# Patient Record
Sex: Female | Born: 1985 | Race: Black or African American | Hispanic: No | Smoking: Never smoker
Health system: Southern US, Community
[De-identification: ages and names within clinical notes are randomized; demographics above are authoritative.]

## PROBLEM LIST (undated history)

## (undated) DIAGNOSIS — J45909 Unspecified asthma, uncomplicated: Secondary | ICD-10-CM

## (undated) DIAGNOSIS — F419 Anxiety disorder, unspecified: Secondary | ICD-10-CM

## (undated) DIAGNOSIS — Z8619 Personal history of other infectious and parasitic diseases: Secondary | ICD-10-CM

## (undated) DIAGNOSIS — R87619 Unspecified abnormal cytological findings in specimens from cervix uteri: Secondary | ICD-10-CM

## (undated) DIAGNOSIS — B9689 Other specified bacterial agents as the cause of diseases classified elsewhere: Secondary | ICD-10-CM

## (undated) DIAGNOSIS — R112 Nausea with vomiting, unspecified: Secondary | ICD-10-CM

## (undated) DIAGNOSIS — M109 Gout, unspecified: Secondary | ICD-10-CM

## (undated) DIAGNOSIS — N83209 Unspecified ovarian cyst, unspecified side: Secondary | ICD-10-CM

## (undated) DIAGNOSIS — N912 Amenorrhea, unspecified: Secondary | ICD-10-CM

## (undated) DIAGNOSIS — O039 Complete or unspecified spontaneous abortion without complication: Secondary | ICD-10-CM

## (undated) DIAGNOSIS — IMO0002 Reserved for concepts with insufficient information to code with codable children: Secondary | ICD-10-CM

## (undated) DIAGNOSIS — I10 Essential (primary) hypertension: Secondary | ICD-10-CM

## (undated) DIAGNOSIS — N760 Acute vaginitis: Secondary | ICD-10-CM

## (undated) DIAGNOSIS — R0602 Shortness of breath: Secondary | ICD-10-CM

## (undated) DIAGNOSIS — N3090 Cystitis, unspecified without hematuria: Secondary | ICD-10-CM

## (undated) DIAGNOSIS — D649 Anemia, unspecified: Secondary | ICD-10-CM

## (undated) DIAGNOSIS — A749 Chlamydial infection, unspecified: Secondary | ICD-10-CM

## (undated) DIAGNOSIS — E785 Hyperlipidemia, unspecified: Secondary | ICD-10-CM

## (undated) DIAGNOSIS — G43909 Migraine, unspecified, not intractable, without status migrainosus: Secondary | ICD-10-CM

## (undated) DIAGNOSIS — Z9889 Other specified postprocedural states: Secondary | ICD-10-CM

## (undated) DIAGNOSIS — B379 Candidiasis, unspecified: Secondary | ICD-10-CM

## (undated) HISTORY — DX: Chlamydial infection, unspecified: A74.9

## (undated) HISTORY — DX: Amenorrhea, unspecified: N91.2

## (undated) HISTORY — DX: Personal history of other infectious and parasitic diseases: Z86.19

## (undated) HISTORY — DX: Other specified bacterial agents as the cause of diseases classified elsewhere: N76.0

## (undated) HISTORY — DX: Reserved for concepts with insufficient information to code with codable children: IMO0002

## (undated) HISTORY — DX: Candidiasis, unspecified: B37.9

## (undated) HISTORY — DX: Hyperlipidemia, unspecified: E78.5

## (undated) HISTORY — DX: Shortness of breath: R06.02

## (undated) HISTORY — DX: Anemia, unspecified: D64.9

## (undated) HISTORY — DX: Anxiety disorder, unspecified: F41.9

## (undated) HISTORY — DX: Unspecified asthma, uncomplicated: J45.909

## (undated) HISTORY — DX: Other specified bacterial agents as the cause of diseases classified elsewhere: B96.89

## (undated) HISTORY — DX: Cystitis, unspecified without hematuria: N30.90

## (undated) HISTORY — DX: Migraine, unspecified, not intractable, without status migrainosus: G43.909

## (undated) HISTORY — PX: TONSILECTOMY, ADENOIDECTOMY, BILATERAL MYRINGOTOMY AND TUBES: SHX2538

## (undated) HISTORY — DX: Unspecified ovarian cyst, unspecified side: N83.209

## (undated) HISTORY — DX: Gout, unspecified: M10.9

## (undated) HISTORY — DX: Essential (primary) hypertension: I10

## (undated) HISTORY — DX: Complete or unspecified spontaneous abortion without complication: O03.9

## (undated) HISTORY — DX: Unspecified abnormal cytological findings in specimens from cervix uteri: R87.619

---

## 2005-04-14 HISTORY — PX: DILATION AND CURETTAGE OF UTERUS: SHX78

## 2007-04-15 HISTORY — PX: OTHER SURGICAL HISTORY: SHX169

## 2008-02-14 ENCOUNTER — Inpatient Hospital Stay (HOSPITAL_COMMUNITY): Admission: AD | Admit: 2008-02-14 | Discharge: 2008-02-14 | Payer: Self-pay | Admitting: Obstetrics and Gynecology

## 2008-02-16 ENCOUNTER — Inpatient Hospital Stay (HOSPITAL_COMMUNITY): Admission: AD | Admit: 2008-02-16 | Discharge: 2008-02-17 | Payer: Self-pay | Admitting: Obstetrics and Gynecology

## 2008-03-02 ENCOUNTER — Inpatient Hospital Stay (HOSPITAL_COMMUNITY): Admission: AD | Admit: 2008-03-02 | Discharge: 2008-03-02 | Payer: Self-pay | Admitting: Obstetrics and Gynecology

## 2008-03-29 ENCOUNTER — Inpatient Hospital Stay (HOSPITAL_COMMUNITY): Admission: AD | Admit: 2008-03-29 | Discharge: 2008-03-29 | Payer: Self-pay | Admitting: Obstetrics and Gynecology

## 2008-03-30 ENCOUNTER — Inpatient Hospital Stay (HOSPITAL_COMMUNITY): Admission: AD | Admit: 2008-03-30 | Discharge: 2008-03-30 | Payer: Self-pay | Admitting: Obstetrics and Gynecology

## 2008-04-16 ENCOUNTER — Inpatient Hospital Stay (HOSPITAL_COMMUNITY): Admission: AD | Admit: 2008-04-16 | Discharge: 2008-04-16 | Payer: Self-pay | Admitting: Obstetrics and Gynecology

## 2008-05-01 ENCOUNTER — Inpatient Hospital Stay (HOSPITAL_COMMUNITY): Admission: AD | Admit: 2008-05-01 | Discharge: 2008-05-04 | Payer: Self-pay | Admitting: Obstetrics and Gynecology

## 2008-05-18 ENCOUNTER — Inpatient Hospital Stay (HOSPITAL_COMMUNITY): Admission: AD | Admit: 2008-05-18 | Discharge: 2008-05-18 | Payer: Self-pay | Admitting: Obstetrics and Gynecology

## 2009-10-23 ENCOUNTER — Inpatient Hospital Stay (HOSPITAL_COMMUNITY): Admission: AD | Admit: 2009-10-23 | Discharge: 2009-10-23 | Payer: Self-pay | Admitting: Obstetrics and Gynecology

## 2009-10-31 ENCOUNTER — Ambulatory Visit (HOSPITAL_COMMUNITY): Admission: RE | Admit: 2009-10-31 | Discharge: 2009-10-31 | Payer: Self-pay | Admitting: Obstetrics and Gynecology

## 2009-11-21 ENCOUNTER — Ambulatory Visit (HOSPITAL_COMMUNITY): Admission: RE | Admit: 2009-11-21 | Discharge: 2009-11-21 | Payer: Self-pay | Admitting: Obstetrics and Gynecology

## 2010-01-31 ENCOUNTER — Inpatient Hospital Stay (HOSPITAL_COMMUNITY): Admission: AD | Admit: 2010-01-31 | Discharge: 2010-02-01 | Payer: Self-pay | Admitting: Obstetrics and Gynecology

## 2010-02-12 ENCOUNTER — Inpatient Hospital Stay (HOSPITAL_COMMUNITY): Admission: AD | Admit: 2010-02-12 | Discharge: 2010-02-12 | Payer: Self-pay | Admitting: Obstetrics and Gynecology

## 2010-03-21 ENCOUNTER — Inpatient Hospital Stay (HOSPITAL_COMMUNITY)
Admission: AD | Admit: 2010-03-21 | Discharge: 2010-03-21 | Payer: Self-pay | Source: Home / Self Care | Admitting: Obstetrics and Gynecology

## 2010-04-14 ENCOUNTER — Inpatient Hospital Stay (HOSPITAL_COMMUNITY)
Admission: AD | Admit: 2010-04-14 | Discharge: 2010-04-14 | Payer: Self-pay | Source: Home / Self Care | Attending: Obstetrics and Gynecology | Admitting: Obstetrics and Gynecology

## 2010-04-24 ENCOUNTER — Inpatient Hospital Stay (HOSPITAL_COMMUNITY)
Admission: RE | Admit: 2010-04-24 | Discharge: 2010-04-26 | Payer: Self-pay | Source: Home / Self Care | Attending: Obstetrics and Gynecology | Admitting: Obstetrics and Gynecology

## 2010-04-24 ENCOUNTER — Encounter (INDEPENDENT_AMBULATORY_CARE_PROVIDER_SITE_OTHER): Payer: Self-pay | Admitting: Obstetrics and Gynecology

## 2010-04-29 LAB — CBC
HCT: 34.9 % — ABNORMAL LOW (ref 36.0–46.0)
HCT: 32.8 % — ABNORMAL LOW (ref 36.0–46.0)
Hemoglobin: 11.7 g/dL — ABNORMAL LOW (ref 12.0–15.0)
Hemoglobin: 10.8 g/dL — ABNORMAL LOW (ref 12.0–15.0)
MCH: 26.5 pg (ref 26.0–34.0)
MCH: 26.2 pg (ref 26.0–34.0)
MCHC: 33.5 g/dL (ref 30.0–36.0)
MCHC: 32.9 g/dL (ref 30.0–36.0)
MCV: 79.1 fL (ref 78.0–100.0)
MCV: 79.4 fL (ref 78.0–100.0)
Platelets: 302 10*3/uL (ref 150–400)
Platelets: 259 10*3/uL (ref 150–400)
RBC: 4.41 MIL/uL (ref 3.87–5.11)
RBC: 4.13 MIL/uL (ref 3.87–5.11)
RDW: 14.5 % (ref 11.5–15.5)
RDW: 14.5 % (ref 11.5–15.5)
WBC: 6.4 10*3/uL (ref 4.0–10.5)
WBC: 6.7 10*3/uL (ref 4.0–10.5)

## 2010-04-29 LAB — RPR: RPR Ser Ql: NONREACTIVE

## 2010-06-18 ENCOUNTER — Other Ambulatory Visit: Payer: Self-pay | Admitting: Family Medicine

## 2010-06-21 ENCOUNTER — Ambulatory Visit
Admission: RE | Admit: 2010-06-21 | Discharge: 2010-06-21 | Disposition: A | Discharge status: Home / Self Care | Payer: BC Managed Care – PPO | Source: Ambulatory Visit | Attending: Family Medicine | Admitting: Family Medicine

## 2010-06-25 LAB — URINALYSIS, ROUTINE W REFLEX MICROSCOPIC
Bilirubin Urine: NEGATIVE
Glucose, UA: NEGATIVE mg/dL
Ketones, ur: NEGATIVE mg/dL
Nitrite: NEGATIVE
Protein, ur: NEGATIVE mg/dL
Specific Gravity, Urine: 1.02 (ref 1.005–1.030)
Urobilinogen, UA: 0.2 mg/dL (ref 0.0–1.0)
pH: 6 (ref 5.0–8.0)

## 2010-06-25 LAB — URINALYSIS, DIPSTICK ONLY
Bilirubin Urine: NEGATIVE
Glucose, UA: NEGATIVE mg/dL
Ketones, ur: 15 mg/dL — AB
Nitrite: NEGATIVE
Protein, ur: NEGATIVE mg/dL
Specific Gravity, Urine: 1.02 (ref 1.005–1.030)
Urobilinogen, UA: 0.2 mg/dL (ref 0.0–1.0)
pH: 6 (ref 5.0–8.0)

## 2010-06-25 LAB — URINE MICROSCOPIC-ADD ON

## 2010-07-29 LAB — COMPREHENSIVE METABOLIC PANEL
ALT: 13 U/L (ref 0–35)
AST: 19 U/L (ref 0–37)
Albumin: 2.7 g/dL — ABNORMAL LOW (ref 3.5–5.2)
Alkaline Phosphatase: 142 U/L — ABNORMAL HIGH (ref 39–117)
BUN: 5 mg/dL — ABNORMAL LOW (ref 6–23)
CO2: 20 mEq/L (ref 19–32)
Calcium: 9 mg/dL (ref 8.4–10.5)
Chloride: 108 mEq/L (ref 96–112)
Creatinine, Ser: 0.32 mg/dL — ABNORMAL LOW (ref 0.4–1.2)
GFR calc Af Amer: >60 mL/min (ref >60)
GFR calc non Af Amer: >60 mL/min (ref >60)
Glucose, Bld: 84 mg/dL (ref 70–99)
Potassium: 3.7 mEq/L (ref 3.5–5.1)
Sodium: 135 mEq/L (ref 135–145)
Total Bilirubin: 0.2 mg/dL — ABNORMAL LOW (ref 0.3–1.2)
Total Protein: 6.5 g/dL (ref 6.0–8.3)

## 2010-07-29 LAB — CBC
HCT: 32.6 % — ABNORMAL LOW (ref 36.0–46.0)
HCT: 35.2 % — ABNORMAL LOW (ref 36.0–46.0)
HCT: 35.3 % — ABNORMAL LOW (ref 36.0–46.0)
Hemoglobin: 11.1 g/dL — ABNORMAL LOW (ref 12.0–15.0)
Hemoglobin: 11.7 g/dL — ABNORMAL LOW (ref 12.0–15.0)
Hemoglobin: 12.1 g/dL (ref 12.0–15.0)
MCHC: 33.1 g/dL (ref 30.0–36.0)
MCHC: 33.9 g/dL (ref 30.0–36.0)
MCHC: 34.3 g/dL (ref 30.0–36.0)
MCV: 81.9 fL (ref 78.0–100.0)
MCV: 82.2 fL (ref 78.0–100.0)
MCV: 82.7 fL (ref 78.0–100.0)
Platelets: 278 10*3/uL (ref 150–400)
Platelets: 346 10*3/uL (ref 150–400)
Platelets: 371 10*3/uL (ref 150–400)
RBC: 3.94 MIL/uL (ref 3.87–5.11)
RBC: 4.28 MIL/uL (ref 3.87–5.11)
RBC: 4.3 MIL/uL (ref 3.87–5.11)
RDW: 13.9 % (ref 11.5–15.5)
RDW: 14.2 % (ref 11.5–15.5)
RDW: 14.3 % (ref 11.5–15.5)
WBC: 6.8 10*3/uL (ref 4.0–10.5)
WBC: 7.8 10*3/uL (ref 4.0–10.5)
WBC: 8.4 10*3/uL (ref 4.0–10.5)

## 2010-07-29 LAB — URINALYSIS, ROUTINE W REFLEX MICROSCOPIC
Bilirubin Urine: NEGATIVE
Glucose, UA: NEGATIVE mg/dL
Hgb urine dipstick: NEGATIVE
Ketones, ur: 15 mg/dL — AB
Nitrite: NEGATIVE
Protein, ur: NEGATIVE mg/dL
Specific Gravity, Urine: 1.025 (ref 1.005–1.030)
Urobilinogen, UA: 0.2 mg/dL (ref 0.0–1.0)
pH: 6 (ref 5.0–8.0)

## 2010-07-29 LAB — LACTATE DEHYDROGENASE: LDH: 129 U/L (ref 94–250)

## 2010-07-29 LAB — RPR
RPR Ser Ql: NONREACTIVE
RPR Ser Ql: NONREACTIVE

## 2010-07-29 LAB — URIC ACID: Uric Acid, Serum: 5.2 mg/dL (ref 2.4–7.0)

## 2010-07-30 LAB — COMPREHENSIVE METABOLIC PANEL
AST: 16 U/L (ref 0–37)
Albumin: 3.6 g/dL (ref 3.5–5.2)
BUN: 12 mg/dL (ref 6–23)
Calcium: 9.3 mg/dL (ref 8.4–10.5)
Creatinine, Ser: 0.62 mg/dL (ref 0.4–1.2)
GFR calc Af Amer: >60 mL/min (ref >60)
GFR calc non Af Amer: >60 mL/min (ref >60)

## 2010-07-30 LAB — URINALYSIS, ROUTINE W REFLEX MICROSCOPIC
Bilirubin Urine: NEGATIVE
Glucose, UA: NEGATIVE mg/dL
Ketones, ur: NEGATIVE mg/dL
Leukocytes, UA: NEGATIVE
Nitrite: NEGATIVE
Protein, ur: NEGATIVE mg/dL
Specific Gravity, Urine: 1.01 (ref 1.005–1.030)
Urobilinogen, UA: 0.2 mg/dL (ref 0.0–1.0)
pH: 6.5 (ref 5.0–8.0)

## 2010-07-30 LAB — CBC
HCT: 38.7 % (ref 36.0–46.0)
MCHC: 33.3 g/dL (ref 30.0–36.0)
MCV: 82.1 fL (ref 78.0–100.0)
Platelets: 380 10*3/uL (ref 150–400)

## 2010-07-30 LAB — URINE MICROSCOPIC-ADD ON

## 2010-07-30 LAB — URIC ACID: Uric Acid, Serum: 7.5 mg/dL — ABNORMAL HIGH (ref 2.4–7.0)

## 2010-08-13 ENCOUNTER — Ambulatory Visit: Payer: BC Managed Care – PPO | Admitting: *Deleted

## 2010-08-27 NOTE — H&P (Signed)
NAMEWILLY, Rebecca Fry               ACCOUNT NO.:  [id number]   MEDICAL RECORD NO.:  [id number]          PATIENT TYPE:  INP   LOCATION:  9169                          FACILITY:  [facility]   PHYSICIAN:  Osborn Coho, M.D.   DATE OF BIRTH:  June 29, 1985   DATE OF ADMISSION:  05/01/2008  DATE OF DISCHARGE:                              HISTORY & PHYSICAL   Rebecca Fry is a 25 year old gravida 4, para 0-1-2-1 at 38-2/7 weeks who  presented complaining of questionable leaking since 6:00 p.m. with  uterine contractions every 7-10 minutes since approximately 3:00 p.m.  She denies any bleeding.  She did report decreased fetal movement today.  Cervix had been 3 cm in the office last week.  She had a last ultrasound  for growth at 34 weeks.  Pregnancy has been remarkable for:  1. History of 36-week delivery.  2. First trimester spotting.  3. Borderline hypertension.  No medications, and she has had BPP every      week.   While in maternity admissions unit.  The patient did demonstrate a small  amount of cervical change.  Fetal heart rate was reassuring but was not  reactive and a decision was made to admit her for further labor care.   PRENATAL LABS:  Blood type is A positive.  Rh antibody negative.  VDRL  nonreactive.  Rubella titer positive.  Hepatitis B surface antigen  negative, HIV is nonreactive.  Sickle cell test was negative.  GC  chlamydia cultures were negative in June.  Pap was normal in August  2009.  Cystic fibrosis testing was negative.  Sickle cell test was  negative.  The patient had a new normal first trimester screen and  normal AFP.  Hemoglobin upon entering the practice was 12.1.  It was  10.8 at 27 weeks.  Glucola was normal at 71.  Fetal fibronectin was done  at 26 weeks and was negative.  She had another negative fetal  fibronectin at 28 weeks.  She had a 24-hour urine done at 34 weeks  showing 97 mg of protein and a 24-hour specimen.  She had PIH workup  done at 29 weeks and  she had some elevated blood pressures.  These were  negative findings.  She had another fetal fibronectin at 31 weeks.  GC  chlamydia cultures were done at 31 weeks and were negative with positive  group B strep culture at 36 weeks.   HISTORY OF PRESENT PREGNANCY:  The patient entered care at approximately  15 weeks.  She had a first trimester screen prior to entering care at  [facility].  This were negative.  She had an AFP that was normal.  She had an ultrasound at 18 weeks showing normal growth and the anterior  placenta.  She was placed on Macrobid at 21 weeks for questionable UTI.  She had a normal Glucola.  She had H1N1 vaccine at 26 weeks.  She had  some cramping at 27 and 28 weeks and was seen at [facility], placed  on Motrin at that time.  She was noted at 28 weeks  to 1.5 cm.  Fetal  fibronectin was done at that time and was negative.  She began to have a  headache at 29 weeks with some swelling.  Her blood pressure at that  time was 150/98.  Previous blood pressures have been 140/82, 120/74.  She was sent to maternity admissions unit at that time for [facility] workup.  A 24-hour urine showed 97 mg of protein and 24-hour specimen.  Blood  pressure continued to have some lability at 31 weeks.  She was evaluated  for questionable leaking.  Fetal fibronectin was done at that time and  was negative.  Blood pressure was 160/90 and 150/88.  Fetal fibronectin  was done at 31 weeks and was negative.  GC chlamydia cultures were sent.  At that time, she began to have a BPPs every week.  She had fairly poor  weight gain during her pregnancy.  At 34 weeks, she had an ultrasound  for growth showing normal growth.  Cervix at that time was closed.  Fluid was normal and growth was 75th percentile.  She has continued to  be followed at [facility] by BPPs weekly.   OBSTETRICAL HISTORY:  In 2005, she had a 5-week miscarriage.  In 2007,  she had a vaginal birth of a female infant weight  7 pounds at 36 weeks.  She was in labor 10 hours.  She had epidural anesthesia.  She did have  preterm labor with that pregnancy.  In August 2007, she had a  termination.   MEDICAL HISTORY:  She is previous patch an IUD user.  Her IUD was  removed approximately October 2008.  She reports one abnormal Pap with a  normal colposcopy.  She reports usual childhood illnesses.  The patient  does have a history of some chronic hypertension in the past,  borderline.  The patient has had a history of anemia in the past and she  had been on B12 shots in the past.  She has some occasional UTIs.   ALLERGIES:  She has no known medication allergies.   SURGICAL HISTORY:  Lipomas in stomach was removed in April 2009 and in  December.  She had a D&C in August 2007 and for the previously noted  termination.   FAMILY HISTORY:  Whole family has hypertension.  Paternal grandmother  has varicose veins.  Her mother has anemia.  Her father and maternal  aunt and paternal uncles have diabetes.  Her paternal grandmother has  thyroid dysfunction.  Paternal grandmother is on dialysis.  Maternal  first cousin has had strokes.  Her cousin used alcohol and nicotine.  The patient does report that any anesthesia wears off quickly..   GENETIC HISTORY:  Remarkable for father of baby with extra digits and  the patient's brother born with a hole in his heart.   SOCIAL HISTORY:  The patient is married to the father of baby.  He is  involved and supportive.  His name is [employer].  The patient is  college educated.  She is a Runner, broadcasting/film/video.  Her husband has 2 years of  college.  He is a bus Hospital doctor.  The patient is Tree surgeon.  She is  of the [place] faith.  She denies any alcohol, drug, or tobacco use  during this pregnancy.  She has been followed by the physician's service  [facility].   PHYSICAL EXAMINATION:  VITAL SIGNS:  Blood pressure 134/84, 151  __________.  HEENT:  Within normal limits.  LUNGS:  Her breath sounds are clear.  HEART:  Regular rate and rhythm without murmur.  BREASTS:  Soft and nontender.  ABDOMEN:  Fundal height is approximately 38 cm.  Estimated fetal weight  7 to 7-1/2 pounds.  Uterine contractions are every 2-3 minutes, mild-to-  moderate quality.  CERVIX:  Initially was 2-3, now it is 3-4, 75% vertex at a -1 station.  There is no evidence of leaking fluid noted with a negative sterile  speculum exam showing no ferning and no pooling.  Fetal heart rate has  been nonreactive, but reassuring.  There was 1 accel with  scalpstimulation and the patient has had some segments of a negative  spontaneous CST.  EXTREMITIES:  Deep tendon reflexes are 2+ without  clonus.  There is trace to 1+ edema noted.   Ultrasound today in maternity admissions unit showed a vertex  presentation.  Estimated fetal weight 7 pounds 12 ounces, approximately  3505 grams at the 82nd percentile.  Amniotic fluid volume was noted to  be subjectively low normal for an AFI volume of 7.66 and at the 8th  percentile.  BPP was 8/8.   IMPRESSION:  1. Intrauterine pregnancy at 38-2/7 weeks.  2. Early labor.  3. Subjectively low normal fluid.  4. Nonreactive fetal heart rate tracing, but overall reassuring.  5. Positive group B streptococcus.   PLAN:  1. Admit to birthing suite, consult with Dr. Osborn Coho as      attending physician.  2. Routine physician orders.  3. We will plan PIH labs with routine admit labs.  4. Group B strep prophylaxis, penicillin G per standard dosing.  5. Pain medication p.r.n.  6. MDs will follow.      Renaldo Reel Emilee Hero, C.N.M.      Osborn Coho, M.D.  Electronically Signed    VLL/MEDQ  D:  05/01/2008  T:  05/02/2008  Job:  [id number]

## 2010-09-02 ENCOUNTER — Encounter: Payer: BC Managed Care – PPO | Attending: Family Medicine | Admitting: *Deleted

## 2010-09-02 DIAGNOSIS — I10 Essential (primary) hypertension: Secondary | ICD-10-CM | POA: Insufficient documentation

## 2010-09-02 DIAGNOSIS — Z713 Dietary counseling and surveillance: Secondary | ICD-10-CM | POA: Insufficient documentation

## 2010-09-02 DIAGNOSIS — E669 Obesity, unspecified: Secondary | ICD-10-CM | POA: Insufficient documentation

## 2010-09-25 ENCOUNTER — Encounter: Payer: BC Managed Care – PPO | Attending: Family Medicine | Admitting: *Deleted

## 2010-09-25 DIAGNOSIS — Z713 Dietary counseling and surveillance: Secondary | ICD-10-CM | POA: Insufficient documentation

## 2010-09-25 DIAGNOSIS — E669 Obesity, unspecified: Secondary | ICD-10-CM | POA: Insufficient documentation

## 2010-09-25 DIAGNOSIS — I10 Essential (primary) hypertension: Secondary | ICD-10-CM | POA: Insufficient documentation

## 2010-10-28 ENCOUNTER — Ambulatory Visit: Payer: BC Managed Care – PPO | Admitting: *Deleted

## 2011-01-15 LAB — URINALYSIS, ROUTINE W REFLEX MICROSCOPIC
Bilirubin Urine: NEGATIVE
Glucose, UA: NEGATIVE
Glucose, UA: NEGATIVE
Hgb urine dipstick: NEGATIVE
Ketones, ur: 15 — AB
Ketones, ur: NEGATIVE
Nitrite: NEGATIVE
Protein, ur: NEGATIVE
Specific Gravity, Urine: 1.025
pH: 6
pH: 6

## 2011-01-15 LAB — COMPREHENSIVE METABOLIC PANEL
ALT: 12
BUN: 2 — ABNORMAL LOW
CO2: 25
Calcium: 9.3
GFR calc non Af Amer: >60
Glucose, Bld: 85
Sodium: 136
Total Protein: 6.2

## 2011-01-15 LAB — WET PREP, GENITAL
Clue Cells Wet Prep HPF POC: NONE SEEN
Trich, Wet Prep: NONE SEEN
Yeast Wet Prep HPF POC: NONE SEEN

## 2011-01-15 LAB — CBC
HCT: 33.4 — ABNORMAL LOW
Hemoglobin: 11.5 — ABNORMAL LOW
MCHC: 34.5
MCV: 82.2
RBC: 4.06
RDW: 14.1

## 2011-01-15 LAB — URINE CULTURE

## 2011-01-15 LAB — LACTATE DEHYDROGENASE: LDH: 110

## 2011-01-15 LAB — URINE MICROSCOPIC-ADD ON

## 2011-01-15 LAB — URIC ACID: Uric Acid, Serum: 4.4

## 2011-01-17 LAB — WET PREP, GENITAL
WBC, Wet Prep HPF POC: NONE SEEN
Yeast Wet Prep HPF POC: NONE SEEN

## 2011-01-17 LAB — URINALYSIS, ROUTINE W REFLEX MICROSCOPIC
Hgb urine dipstick: NEGATIVE
Nitrite: NEGATIVE
Specific Gravity, Urine: 1.01 (ref 1.005–1.030)
Urobilinogen, UA: 0.2 mg/dL (ref 0.0–1.0)
pH: 5.5 (ref 5.0–8.0)

## 2011-01-17 LAB — GC/CHLAMYDIA PROBE AMP, GENITAL: Chlamydia, DNA Probe: NEGATIVE

## 2011-01-17 LAB — FETAL FIBRONECTIN: Fetal Fibronectin: POSITIVE

## 2011-10-05 DIAGNOSIS — N939 Abnormal uterine and vaginal bleeding, unspecified: Secondary | ICD-10-CM | POA: Diagnosis present

## 2011-11-02 ENCOUNTER — Other Ambulatory Visit: Payer: Self-pay

## 2011-11-02 ENCOUNTER — Emergency Department (HOSPITAL_COMMUNITY)
Admission: EM | Admit: 2011-11-02 | Discharge: 2011-11-03 | Disposition: A | Discharge status: Home / Self Care | Payer: BC Managed Care – PPO | Attending: Emergency Medicine | Admitting: Emergency Medicine

## 2011-11-02 DIAGNOSIS — I10 Essential (primary) hypertension: Secondary | ICD-10-CM | POA: Insufficient documentation

## 2011-11-02 DIAGNOSIS — R42 Dizziness and giddiness: Secondary | ICD-10-CM | POA: Insufficient documentation

## 2011-11-02 DIAGNOSIS — R079 Chest pain, unspecified: Secondary | ICD-10-CM | POA: Insufficient documentation

## 2011-11-02 DIAGNOSIS — E119 Type 2 diabetes mellitus without complications: Secondary | ICD-10-CM | POA: Insufficient documentation

## 2011-11-03 ENCOUNTER — Emergency Department (HOSPITAL_COMMUNITY): Payer: BC Managed Care – PPO

## 2011-11-03 ENCOUNTER — Telehealth: Payer: Self-pay | Admitting: Obstetrics and Gynecology

## 2011-11-03 MED FILL — Meclizine HCl Tab 25 MG: ORAL | Qty: 1 | Status: AC

## 2011-11-03 MED FILL — Lorazepam Tab 1 MG: ORAL | Qty: 1 | Status: AC

## 2011-11-03 NOTE — Telephone Encounter (Signed)
Pt called needing follow-up appointment regarding ER visit at [facility]/ early pregnancy spotting.Rebecca Fry

## 2011-11-03 NOTE — ED Notes (Signed)
[id number]<AV> Expected date:<BR> Expected time:<BR> Means of arrival:<BR> Comments:<BR> Downtime Alleen Borne

## 2011-11-04 ENCOUNTER — Encounter: Payer: BC Managed Care – PPO | Admitting: Obstetrics and Gynecology

## 2011-11-11 ENCOUNTER — Ambulatory Visit: Payer: BC Managed Care – PPO | Admitting: Obstetrics and Gynecology

## 2011-11-21 ENCOUNTER — Encounter: Payer: Self-pay | Admitting: Obstetrics and Gynecology

## 2011-11-26 ENCOUNTER — Ambulatory Visit: Payer: BC Managed Care – PPO | Admitting: Obstetrics and Gynecology

## 2012-02-04 ENCOUNTER — Encounter: Payer: BC Managed Care – PPO | Admitting: Obstetrics and Gynecology

## 2012-03-15 ENCOUNTER — Ambulatory Visit (INDEPENDENT_AMBULATORY_CARE_PROVIDER_SITE_OTHER): Payer: BC Managed Care – PPO | Admitting: Obstetrics and Gynecology

## 2012-03-15 ENCOUNTER — Encounter: Payer: Self-pay | Admitting: Obstetrics and Gynecology

## 2012-03-15 VITALS — BP 120/78 | Temp 98.4°F | Wt 232.0 lb

## 2012-03-15 DIAGNOSIS — I1A0 Resistant hypertension: Secondary | ICD-10-CM | POA: Insufficient documentation

## 2012-03-15 DIAGNOSIS — Z87898 Personal history of other specified conditions: Secondary | ICD-10-CM

## 2012-03-15 DIAGNOSIS — N939 Abnormal uterine and vaginal bleeding, unspecified: Secondary | ICD-10-CM

## 2012-03-15 DIAGNOSIS — N898 Other specified noninflammatory disorders of vagina: Secondary | ICD-10-CM

## 2012-03-15 DIAGNOSIS — I10 Essential (primary) hypertension: Secondary | ICD-10-CM | POA: Insufficient documentation

## 2012-03-15 DIAGNOSIS — Z8742 Personal history of other diseases of the female genital tract: Secondary | ICD-10-CM

## 2012-03-15 HISTORY — DX: Resistant hypertension: I1A.0

## 2012-03-15 LAB — POCT URINALYSIS DIPSTICK
Glucose, UA: NEGATIVE
Leukocytes, UA: NEGATIVE
Urobilinogen, UA: NEGATIVE

## 2012-03-15 LAB — POCT URINE PREGNANCY: Preg Test, Ur: NEGATIVE

## 2012-03-15 MED ORDER — MEDROXYPROGESTERONE ACETATE 10 MG PO TABS: ORAL_TABLET | ORAL | Status: DC

## 2012-03-15 NOTE — Progress Notes (Signed)
Subjective:    Rebecca Fry is a 26 y.o. female, [phone], who presents for vaginal bleeding x 6 mos. Off and on almost daily.  At its heaviest it can require pad changes every 2 hours.  Some cramping with bleeding  The following portions of the patient's history were reviewed and updated as appropriate: allergies, current medications, past family history. Irregular bleeding When did bleeding start: 6 mos ago How  Long: current How often changing pad/tampon: She wears two pads with a tampon changing every one to two hours. After approximately two weeks, bleeding finally eases up changing one pap with tampon every 4 hrs.  Bleeding Disorders: no Cramping: yes Contraception: no Fibroids: no Hormone Therapy: no New Medications: no Menopausal Symptoms: no Vag. Discharge: yes  Abdominal Pain: yes Increased Stress: yes  Review of Systems Pertinent items are noted in HPI. Breast:Negative for breast lump,nipple discharge or nipple retraction Gastrointestinal: Negative for abdominal pain, change in bowel habits or rectal bleeding Urinary:negative   Objective:    BP 120/78  Resp 18  Wt 232 lb (105.235 kg)  LMP 03/13/2012  Breastfeeding? No    Weight:  Wt Readings from Last 1 Encounters:  03/15/12 232 lb (105.235 kg)          BMI: There is no height on file to calculate BMI.  General Appearance: Alert, appropriate appearance for age. Obese.  No acute distress Pelvic exam:  VULVA: normal appearing vulva with no masses, tenderness or lesions,    VAGINA: normal appearing vagina with normal color and discharge, no lesions,    CERVIX: normal appearing cervix without discharge or lesions, small amot contact bleeding,    UTERUS: uterus is normal size, shape, consistency and nontender, Exam is limited by body habitus,    ADNEXA: no masses, Exam is limited by body habitus,    RECTAL: normal rectal, no masses.   Assessment:    abnormal uterine bleeding with hx most consistent with polyp  or fibroid    Plan:   TSH, CBC pap smear Provera 40 mg daily until bleeding stops.  Pt to call to schedule [facility] as soon as bleeding stop  Dierdre Forth MD

## 2012-03-16 LAB — PAP IG W/ RFLX HPV ASCU

## 2012-03-16 LAB — CBC
HCT: 36.6 % (ref 36.0–46.0)
Hemoglobin: 12.3 g/dL (ref 12.0–15.0)
MCH: 25.5 pg — ABNORMAL LOW (ref 26.0–34.0)
MCV: 75.8 fL — ABNORMAL LOW (ref 78.0–100.0)
RBC: 4.83 MIL/uL (ref 3.87–5.11)

## 2012-03-24 ENCOUNTER — Other Ambulatory Visit: Payer: Self-pay

## 2012-03-24 ENCOUNTER — Ambulatory Visit (INDEPENDENT_AMBULATORY_CARE_PROVIDER_SITE_OTHER): Payer: BC Managed Care – PPO

## 2012-03-24 ENCOUNTER — Ambulatory Visit (INDEPENDENT_AMBULATORY_CARE_PROVIDER_SITE_OTHER): Payer: BC Managed Care – PPO | Admitting: Obstetrics and Gynecology

## 2012-03-24 ENCOUNTER — Other Ambulatory Visit: Payer: Self-pay | Admitting: Obstetrics and Gynecology

## 2012-03-24 DIAGNOSIS — N939 Abnormal uterine and vaginal bleeding, unspecified: Secondary | ICD-10-CM | POA: Insufficient documentation

## 2012-03-24 DIAGNOSIS — N898 Other specified noninflammatory disorders of vagina: Secondary | ICD-10-CM

## 2012-03-24 LAB — HCG, QUANTITATIVE, PREGNANCY: hCG, Beta Chain, Quant, S: <2 m[IU]/mL

## 2012-03-24 NOTE — Progress Notes (Signed)
SONOHYSTEROGRAM  Indications for the procedure, risks and benefits have been reviewed.  Questions were answered.   A permit has been signed.  An ultrasound was performed.   PROCEDURE The vagina and cervix were prepped with Betadine.  The sonohysterogram catheter was placed inside the uterus.  20 cc of sterile saline were injected.  A 3-D ultrasound was perfored   The patient tolerated her procedure well.  All instruments were removed.  The patient was returned to the supine position.  Results  ULTRASOUND: Uterus: Length: 7.59 cm   Width:  4.66 cm   Height:  4.01 cm  Endo thickness:  0.774 cm   Left ovary:Normal Right ovary:simple cyst/follicle. Measures: 3.0cmx2.7cmx2.7cm Fibroids:  none  CDS fluid:no Comment: Urinary bladder-unremarkable. Anteverted uterus. There is a 4.74mm, shadowing, echogenic focus in right myometrium. Could represent a calcified gland, vessel, or fibroid. There is asymmetric thickening of the endometrium on the posterior and left lateral cavity walls. Right cavity walls are clean and smooth in appearance.   ASSEESSMENT: New onset menorrhagia, initially responsive to Provera, but now with breakthrough bleeding Asymmetrical thickening of the endometrial lining without any clearly discernible mass  PLAN: Hysteroscopy D&C recommended and accepted. The risks of anesthesia bleeding infection and damage to adjacent organs have been reviewed. The specific risk of uterine perforation was reviewed. The patient wishes to proceed Quantitative hCG Continue Provera 40 mg daily until surgical procedure

## 2012-03-26 ENCOUNTER — Other Ambulatory Visit: Payer: Self-pay | Admitting: Obstetrics and Gynecology

## 2012-03-26 ENCOUNTER — Telehealth: Payer: Self-pay | Admitting: Obstetrics and Gynecology

## 2012-03-26 NOTE — Telephone Encounter (Signed)
Hysteroscopy D&C scheduled for 04/05/12 @ 9:30 with [facility].  BCBS effective 10/13/11; Plan pays 80/20 after a $350 deductible. Pre-op due $78.66.  -Adrianne Pridgen

## 2012-03-28 ENCOUNTER — Telehealth: Payer: Self-pay | Admitting: Obstetrics and Gynecology

## 2012-03-28 NOTE — Telephone Encounter (Signed)
TC to patient to review Dr. Lilian Coma recommendations.  LM on VM for patient to call me back. To CTO, rest, fluids, call if bleeding continues by later this afternoon. To be NPO at present.

## 2012-03-28 NOTE — Telephone Encounter (Signed)
TC from patient in response to Dr. Lilian Coma call. Still having bleeding to same degree.  No dizziness or weakness. Has eaten breakfast and lunch today.  Took Provera 40 mg po this am  Encouraged patient to rest today.  She plans to work tomorrow.  She doesn't feel this bleeding will keep her from working. Push fluids, now that she has already eaten. Ibuprophen 800 mg po q 6-8 hours x 24 hours. Call if bleeding increases,etc. F/U with office this week if bleeding continues.

## 2012-03-28 NOTE — Telephone Encounter (Signed)
TC from patient--awaiting hysteroscopy and D&C on 12/23 for DUB.  Has been bleeding since June--has gotten even heavier today, bleeding through super pad in 20 min.  No dizziness, or other sx. Currently on Provera 40 mg daily. Advised patient I would consult with Dr. Pennie Rushing regarding options (Rx vs MAU evaluation).

## 2012-03-31 ENCOUNTER — Encounter (HOSPITAL_COMMUNITY): Payer: Self-pay | Admitting: Pharmacist

## 2012-04-02 ENCOUNTER — Encounter (HOSPITAL_COMMUNITY): Payer: Self-pay | Admitting: *Deleted

## 2012-04-02 ENCOUNTER — Inpatient Hospital Stay (HOSPITAL_COMMUNITY): Admission: RE | Admit: 2012-04-02 | Payer: BC Managed Care – PPO | Source: Ambulatory Visit

## 2012-04-04 NOTE — H&P (Signed)
Rebecca Fry is an 26 y.o. female. Who presents for further evaluation and treatment of a 6 month history of abnormal uterine bleeding.  The pt bleeds almost daily and had only brief improvement with Provera medication. She has had no bleeding since 04/03/12.  She has had a negative quantitative HCG, normal TSH and HGB.  Her pap was nl as well.  She denies any pain with the bleeding but it can be heavy enough to require q2 hr pad changes or to soil clothing.  Prior to 6 months ago, the patient remembers having normal monthly menses, and has had 3 easily conceived pregnancies.  Pertinent Gynecological History: Menses: see above Bleeding: see above Contraception: none DES exposure: none known Blood transfusions: none Sexually transmitted diseases: no past history Previous GYN Procedures: DNC  Last mammogram: n/a Last pap: normal Date: 03/2012 OB History: G6, P3-0-3-3   Menstrual History: Menarche age: 52 Patient's last menstrual period was 03/27/2012.    Past Medical History  Diagnosis Date  . Migraine   . Bladder infection   . Anemia   . Hypertension   . History of chicken pox   . Yeast infection   . Chlamydia   . Amenorrhea   . SAB (spontaneous abortion)   . Ovarian cyst   . BV (bacterial vaginosis)   . Abnormal Pap smear     colpo  . PONV (postoperative nausea and vomiting)     after surgery in 2009-nausea & vomiting    Past Surgical History  Procedure Date  . Tonsilectomy, adenoidectomy, bilateral myringotomy and tubes   . Lipomas removal 2009  . Dilation and curettage of uterus 2007    Family History  Problem Relation Age of Onset  . Heart disease Paternal Grandfather   . Heart disease Father   . Hypertension Father   . Diabetes Father   . Irritable bowel syndrome Paternal Grandmother   . Thyroid disease Paternal Grandmother   . Hypertension Mother   . Anemia Mother   . Diabetes Maternal Aunt   . Diabetes Paternal Uncle     Social History:  reports  that she has never smoked. She has never used smokeless tobacco. She reports that she does not drink alcohol or use illicit drugs.  Allergies: No Known Allergies  Prescriptions prior to admission  Medication Sig Dispense Refill  . medroxyPROGESTERone (PROVERA) 10 MG tablet Pt to take 4 tablets by mouth daily(40mg  total)  142 tablet  0  . Olmesartan-Amlodipine-HCTZ 40-10-25 MG TABS Take 1 tablet by mouth. Tribenzor        Review of Systems  Constitutional: Negative.  Negative for fever and chills.  HENT: Positive for congestion and sore throat.        URI sinc2 04/03/12  Eyes: Negative.  Negative for discharge and redness.  Respiratory: Positive for cough and sputum production. Negative for shortness of breath.        Cough since 04/03/12 with minimal clear sputum production  Cardiovascular: Positive for chest pain.       Chest pain with coughing  Gastrointestinal: Negative.   Genitourinary: Negative.   Musculoskeletal: Negative.   Skin: Negative.   Neurological: Negative.   Endo/Heme/Allergies: Negative.   Psychiatric/Behavioral: Negative.     Blood pressure 135/79, pulse 84, temperature 99 F (37.2 C), temperature source Oral, resp. rate 17, height 5' 5.5" (1.664 m), weight 232 lb (105.235 kg), last menstrual period 03/27/2012, SpO2 99.00%. Physical Exam  Constitutional: She is oriented to person, place, and time. She  appears well-developed and well-nourished.  HENT:  Head: Normocephalic and atraumatic.  Eyes: Conjunctivae normal and EOM are normal. Pupils are equal, round, and reactive to light.  Neck: Normal range of motion. Neck supple.  Cardiovascular: Normal rate and regular rhythm.   Respiratory: Effort normal and breath sounds normal.  GI: Soft. Bowel sounds are normal.  Genitourinary:       PELVIC: VULVA: normal appearing vulva with no masses, tenderness or lesions,                           VAGINA: normal appearing vagina with normal color and discharge, no lesions,                            CERVIX: normal appearing cervix without discharge or lesions, small amot contact bleeding,                           UTERUS: uterus is normal size, shape, consistency and nontender, Exam is limited by body habitus,                          ADNEXA: no masses, Exam is limited by body habitus,                           RECTAL: normal rectal, no masses.   Musculoskeletal: Normal range of motion.  Neurological: She is alert and oriented to person, place, and time. She has normal reflexes.  Skin: Skin is warm and dry.  Psychiatric: She has a normal mood and affect.    Results for orders placed during the hospital encounter of 04/05/12 (from the past 24 hour(s))  CBC     Status: Abnormal   Collection Time   04/05/12  8:12 AM      Component Value Range   WBC 4.6  4.0 - 10.5 K/uL   RBC 4.80  3.87 - 5.11 MIL/uL   Hemoglobin 12.1  12.0 - 15.0 g/dL   HCT 16.1  09.6 - 04.5 %   MCV 79.8  78.0 - 100.0 fL   MCH 25.2 (*) 26.0 - 34.0 pg   MCHC 31.6  30.0 - 36.0 g/dL   RDW 40.9  81.1 - 91.4 %   Platelets 367  150 - 400 K/uL  BASIC METABOLIC PANEL     Status: Normal   Collection Time   04/05/12  8:12 AM      Component Value Range   Sodium 138  135 - 145 mEq/L   Potassium 3.9  3.5 - 5.1 mEq/L   Chloride 103  96 - 112 mEq/L   CO2 25  19 - 32 mEq/L   Glucose, Bld 95  70 - 99 mg/dL   BUN 8  6 - 23 mg/dL   Creatinine, Ser 7.82  0.50 - 1.10 mg/dL   Calcium 9.4  8.4 - 95.6 mg/dL   GFR calc non Af Amer >90  >90 mL/min   GFR calc Af Amer >90  >90 mL/min  PREGNANCY, URINE     Status: Normal   Collection Time   04/05/12  8:30 AM      Component Value Range   Preg Test, Ur NEGATIVE  NEGATIVE    No results found. ULTRASOUND:  Uterus: Length: 7.59 cm  Width: 4.66 cm  Height: 4.01  cm  Endo thickness: 0.774 cm  Left ovary:Normal  Right ovary:simple cyst/follicle. Measures: 3.0cmx2.7cmx2.7cm  Fibroids: none  CDS fluid:no  Comment: Urinary bladder-unremarkable. Anteverted  uterus. There is a 4.41mm, shadowing, echogenic focus in right myometrium. Could represent a calcified gland, vessel, or fibroid. There is asymmetric thickening of the endometrium on the posterior and left lateral cavity walls. Right cavity walls are clean and smooth in appearance.   Assessment/ Abnormal uterine bleeding unresponsive to hormone therapy Abnormal endometrial thickening? Retained placental tissue Recent URI  Plan Hysteroscopy/D&C recommend and accepted.  The indications, risks and benefits have been reviewed in detail.  The risks of bleeding, infection, anesthesia, and damage to adjacent organs with the specific risk of uterine perforation have been reviewed.  The patient wishes to proceed. IV Ancef  Shalandra Leu P 04/05/2012, 9:29 AM

## 2012-04-05 ENCOUNTER — Ambulatory Visit (HOSPITAL_COMMUNITY)
Admission: RE | Admit: 2012-04-05 | Discharge: 2012-04-05 | Disposition: A | Discharge status: Home / Self Care | Payer: BC Managed Care – PPO | Source: Ambulatory Visit | Attending: Obstetrics and Gynecology | Admitting: Obstetrics and Gynecology

## 2012-04-05 ENCOUNTER — Encounter (HOSPITAL_COMMUNITY): Payer: Self-pay | Admitting: General Surgery

## 2012-04-05 ENCOUNTER — Encounter (HOSPITAL_COMMUNITY)
Admission: RE | Disposition: A | Discharge status: Home / Self Care | Payer: Self-pay | Source: Ambulatory Visit | Attending: Obstetrics and Gynecology

## 2012-04-05 ENCOUNTER — Encounter (HOSPITAL_COMMUNITY): Payer: Self-pay | Admitting: Anesthesiology

## 2012-04-05 ENCOUNTER — Ambulatory Visit (HOSPITAL_COMMUNITY): Payer: BC Managed Care – PPO | Admitting: Anesthesiology

## 2012-04-05 DIAGNOSIS — J069 Acute upper respiratory infection, unspecified: Secondary | ICD-10-CM

## 2012-04-05 DIAGNOSIS — N926 Irregular menstruation, unspecified: Secondary | ICD-10-CM

## 2012-04-05 DIAGNOSIS — N939 Abnormal uterine and vaginal bleeding, unspecified: Secondary | ICD-10-CM | POA: Diagnosis present

## 2012-04-05 DIAGNOSIS — N920 Excessive and frequent menstruation with regular cycle: Secondary | ICD-10-CM | POA: Insufficient documentation

## 2012-04-05 DIAGNOSIS — N9489 Other specified conditions associated with female genital organs and menstrual cycle: Secondary | ICD-10-CM | POA: Diagnosis present

## 2012-04-05 DIAGNOSIS — R9389 Abnormal findings on diagnostic imaging of other specified body structures: Secondary | ICD-10-CM | POA: Insufficient documentation

## 2012-04-05 HISTORY — PX: HYSTEROSCOPY WITH D & C: SHX1775

## 2012-04-05 HISTORY — DX: Nausea with vomiting, unspecified: R11.2

## 2012-04-05 HISTORY — DX: Other specified postprocedural states: R11.2

## 2012-04-05 HISTORY — DX: Other specified postprocedural states: Z98.890

## 2012-04-05 LAB — PREGNANCY, URINE: Preg Test, Ur: NEGATIVE

## 2012-04-05 LAB — BASIC METABOLIC PANEL
Calcium: 9.4 mg/dL (ref 8.4–10.5)
Chloride: 103 mEq/L (ref 96–112)
Creatinine, Ser: 0.5 mg/dL (ref 0.50–1.10)
GFR calc Af Amer: >90 mL/min (ref >90)
GFR calc non Af Amer: >90 mL/min (ref >90)

## 2012-04-05 LAB — CBC
MCH: 25.2 pg — ABNORMAL LOW (ref 26.0–34.0)
MCV: 79.8 fL (ref 78.0–100.0)
Platelets: 367 10*3/uL (ref 150–400)
RDW: 15 % (ref 11.5–15.5)
WBC: 4.6 10*3/uL (ref 4.0–10.5)

## 2012-04-05 SURGERY — DILATATION AND CURETTAGE /HYSTEROSCOPY
Anesthesia: Spinal | Site: Vagina | Wound class: Clean Contaminated

## 2012-04-05 MED ORDER — FENTANYL CITRATE 0.05 MG/ML IJ SOLN
INTRAMUSCULAR | Status: DC | PRN
  Administered 2012-04-05 (×2): 50 ug via INTRAVENOUS

## 2012-04-05 MED ORDER — FENTANYL CITRATE 0.05 MG/ML IJ SOLN
INTRAMUSCULAR | Status: AC
  Filled 2012-04-05: qty 5

## 2012-04-05 MED ORDER — CEFAZOLIN SODIUM-DEXTROSE 2-3 GM-% IV SOLR
2.0000 g | Freq: Once | INTRAVENOUS | Status: AC
  Administered 2012-04-05: 2 g via INTRAVENOUS
  Filled 2012-04-05: qty 50

## 2012-04-05 MED ORDER — KETOROLAC TROMETHAMINE 30 MG/ML IJ SOLN
INTRAMUSCULAR | Status: AC
  Filled 2012-04-05: qty 1

## 2012-04-05 MED ORDER — LIDOCAINE HCL (CARDIAC) 20 MG/ML IV SOLN
INTRAVENOUS | Status: AC
  Filled 2012-04-05: qty 5

## 2012-04-05 MED ORDER — FENTANYL CITRATE 0.05 MG/ML IJ SOLN
INTRAMUSCULAR | Status: AC
  Filled 2012-04-05: qty 2

## 2012-04-05 MED ORDER — MIDAZOLAM HCL 2 MG/2ML IJ SOLN
INTRAMUSCULAR | Status: AC
  Filled 2012-04-05: qty 2

## 2012-04-05 MED ORDER — ONDANSETRON HCL 4 MG/2ML IJ SOLN
INTRAMUSCULAR | Status: AC
  Filled 2012-04-05: qty 2

## 2012-04-05 MED ORDER — LIDOCAINE HCL 2 % IJ SOLN
INTRAMUSCULAR | Status: AC
  Filled 2012-04-05: qty 20

## 2012-04-05 MED ORDER — SCOPOLAMINE 1 MG/3DAYS TD PT72
MEDICATED_PATCH | TRANSDERMAL | Status: AC
  Filled 2012-04-05: qty 1

## 2012-04-05 MED ORDER — PROMETHAZINE HCL 6.25 MG/5ML PO SYRP: 12.5000 mg | ORAL_SOLUTION | Freq: Four times a day (QID) | ORAL | Status: DC | PRN

## 2012-04-05 MED ORDER — CEFAZOLIN SODIUM-DEXTROSE 2-3 GM-% IV SOLR
INTRAVENOUS | Status: AC
  Filled 2012-04-05: qty 50

## 2012-04-05 MED ORDER — GLYCINE 1.5 % IR SOLN
Status: DC | PRN
  Administered 2012-04-05: 3000 mL

## 2012-04-05 MED ORDER — DEXAMETHASONE SODIUM PHOSPHATE 4 MG/ML IJ SOLN
8.0000 mg | Freq: Once | INTRAMUSCULAR | Status: AC
  Administered 2012-04-05: 8 mg via INTRAVENOUS

## 2012-04-05 MED ORDER — IBUPROFEN 600 MG PO TABS: 600.0000 mg | ORAL_TABLET | Freq: Four times a day (QID) | ORAL | Status: DC

## 2012-04-05 MED ORDER — FENTANYL CITRATE 0.05 MG/ML IJ SOLN
25.0000 ug | INTRAMUSCULAR | Status: DC | PRN
  Administered 2012-04-05 (×3): 50 ug via INTRAVENOUS

## 2012-04-05 MED ORDER — DEXAMETHASONE SODIUM PHOSPHATE 4 MG/ML IJ SOLN
INTRAMUSCULAR | Status: AC
  Filled 2012-04-05: qty 2

## 2012-04-05 MED ORDER — 0.9 % SODIUM CHLORIDE (POUR BTL) OPTIME
TOPICAL | Status: DC | PRN
  Administered 2012-04-05: 1000 mL

## 2012-04-05 MED ORDER — SCOPOLAMINE 1 MG/3DAYS TD PT72
1.0000 | MEDICATED_PATCH | TRANSDERMAL | Status: DC
  Administered 2012-04-05 (×2): 1.5 mg via TRANSDERMAL

## 2012-04-05 MED ORDER — MIDAZOLAM HCL 5 MG/5ML IJ SOLN
INTRAMUSCULAR | Status: DC | PRN
  Administered 2012-04-05: 2 mg via INTRAVENOUS

## 2012-04-05 MED ORDER — ONDANSETRON HCL 4 MG/2ML IJ SOLN
INTRAMUSCULAR | Status: DC | PRN
  Administered 2012-04-05: 4 mg via INTRAVENOUS

## 2012-04-05 MED ORDER — KETOROLAC TROMETHAMINE 30 MG/ML IJ SOLN
INTRAMUSCULAR | Status: DC | PRN
  Administered 2012-04-05: 30 mg via INTRAVENOUS
  Administered 2012-04-05: 30 mg via INTRAMUSCULAR

## 2012-04-05 MED ORDER — PROPOFOL 10 MG/ML IV EMUL
INTRAVENOUS | Status: AC
  Filled 2012-04-05: qty 20

## 2012-04-05 MED ORDER — LACTATED RINGERS IV SOLN
INTRAVENOUS | Status: DC
  Administered 2012-04-05 (×3): via INTRAVENOUS

## 2012-04-05 MED ORDER — SCOPOLAMINE 1 MG/3DAYS TD PT72
MEDICATED_PATCH | TRANSDERMAL | Status: AC
  Administered 2012-04-05: 1.5 mg via TRANSDERMAL
  Filled 2012-04-05: qty 1

## 2012-04-05 SURGICAL SUPPLY — 24 items
BOOTIES KNEE HIGH SLOAN (MISCELLANEOUS) ×4 IMPLANT
CANISTER SUCTION 2500CC (MISCELLANEOUS) ×2 IMPLANT
CATH ROBINSON RED A/P 16FR (CATHETERS) ×2 IMPLANT
CLOTH BEACON ORANGE TIMEOUT ST (SAFETY) ×2 IMPLANT
CONTAINER PREFILL 10% NBF 60ML (FORM) ×4 IMPLANT
CORD ACTIVE DISPOSABLE (ELECTRODE)
CORD ELECTRO ACTIVE DISP (ELECTRODE) IMPLANT
DILATOR CANAL MILEX (MISCELLANEOUS) IMPLANT
DRESSING TELFA 8X3 (GAUZE/BANDAGES/DRESSINGS) ×2 IMPLANT
ELECT LOOP GYNE PRO 24FR (CUTTING LOOP)
ELECT REM PT RETURN 9FT ADLT (ELECTROSURGICAL)
ELECT VAPORTRODE GRVD BAR (ELECTRODE) IMPLANT
ELECTRODE LOOP GYNE PRO 24FR (CUTTING LOOP) IMPLANT
ELECTRODE REM PT RTRN 9FT ADLT (ELECTROSURGICAL) IMPLANT
ELECTRODE ROLLER VERSAPOINT (ELECTRODE) IMPLANT
ELECTRODE RT ANGLE VERSAPOINT (CUTTING LOOP) IMPLANT
ELECTRODE TWIZZLE TIP (MISCELLANEOUS) IMPLANT
GLOVE SURG SS PI 6.5 STRL IVOR (GLOVE) ×4 IMPLANT
GOWN STRL REIN XL XLG (GOWN DISPOSABLE) ×4 IMPLANT
LOOP ANGLED CUTTING 22FR (CUTTING LOOP) IMPLANT
PACK HYSTEROSCOPY LF (CUSTOM PROCEDURE TRAY) ×2 IMPLANT
PAD OB MATERNITY 4.3X12.25 (PERSONAL CARE ITEMS) ×2 IMPLANT
TOWEL OR 17X24 6PK STRL BLUE (TOWEL DISPOSABLE) ×4 IMPLANT
WATER STERILE IRR 1000ML POUR (IV SOLUTION) ×2 IMPLANT

## 2012-04-05 NOTE — Anesthesia Postprocedure Evaluation (Signed)
  Anesthesia Post-op Note  Patient: Rebecca Fry  Procedure(s) Performed: Procedure(s) (LRB) with comments: DILATATION AND CURETTAGE /HYSTEROSCOPY (N/A)  Patient is awake, responsive, moving her legs, and has signs of resolution of her numbness. Pain and nausea are reasonably well controlled. Vital signs are stable and clinically acceptable. Oxygen saturation is clinically acceptable. There are no apparent anesthetic complications at this time. Patient is ready for discharge.

## 2012-04-05 NOTE — Transfer of Care (Signed)
Immediate Anesthesia Transfer of Care Note  Patient: Rebecca Fry  Procedure(s) Performed: Procedure(s) (LRB) with comments: DILATATION AND CURETTAGE /HYSTEROSCOPY (N/A)  Patient Location: PACU  Anesthesia Type:Spinal  Level of Consciousness: awake  Airway & Oxygen Therapy: Patient Spontanous Breathing and Patient connected to nasal cannula oxygen  Post-op Assessment: Report given to PACU RN  Post vital signs: Reviewed and stable  Complications: No apparent anesthesia complications

## 2012-04-05 NOTE — Anesthesia Preprocedure Evaluation (Signed)
Anesthesia Evaluation    History of Anesthesia Complications (+) PONV  Airway       Dental   Pulmonary Recent URI , Residual Cough,          Cardiovascular hypertension, On Medications     Neuro/Psych    GI/Hepatic   Endo/Other  Morbid obesity  Renal/GU      Musculoskeletal   Abdominal   Peds  Hematology  (+) anemia ,   Anesthesia Other Findings   Reproductive/Obstetrics                           Anesthesia Physical Anesthesia Plan  ASA: III  Anesthesia Plan: Spinal   Post-op Pain Management:    Induction:   Airway Management Planned:   Additional Equipment:   Intra-op Plan:   Post-operative Plan:   Informed Consent: I have reviewed the patients History and Physical, chart, labs and discussed the procedure including the risks, benefits and alternatives for the proposed anesthesia with the patient or authorized representative who has indicated his/her understanding and acceptance.   Dental Advisory Given  Plan Discussed with: CRNA  Anesthesia Plan Comments: (Lab work confirmed with CRNA in room. Platelets okay. Discussed recent URI spinal anesthetic, and patient consents to the procedure:  included risk of possible headache,backache, failed block, allergic reaction, and nerve injury. This patient was asked if she had any questions or concerns before the procedure started. )        Anesthesia Quick Evaluation

## 2012-04-05 NOTE — Op Note (Signed)
04/05/2012  11:05 AM  PATIENT:  Rebecca Fry  26 y.o. female  PRE-OPERATIVE DIAGNOSIS:  Menorrhagia  POST-OPERATIVE DIAGNOSIS:  menorrghia  PROCEDURE:  Procedure(s): DILATATION AND CURETTAGE /HYSTEROSCOPY  SURGEON:  Avary Eichenberger P, MD  ASSISTANTS: none  ANESTHESIA:   spinal  ESTIMATED BLOOD LOSS: minimal  BLOOD ADMINISTERED:none  COMPLICATIONS:none  FINDINGS: The uterus sounded to 8-1/2 cm. At hysteroscopy the endometrial cavity was for the most part free of any lesions. And the posterior her right cornual region near the fundus there was a 3 mm elevation of the endometrium not exactly polypoid age but also not of the consistency of the fibroid. This collection of tissue had a hemorrhagic surface. The tubal ostia could be clearly identified on both sides. No specific endometrial polyp or submucosal myoma could be identified.  FLUID DEFICIT:140cc  LOCAL MEDICATIONS USED:  NONE  SPECIMEN:  Source of Specimen:  Endometrial curettings and endometrial lesion  DISPOSITION OF SPECIMEN:  PATHOLOGY  COUNTS:  YES  DESCRIPTION OF PROCEDURE:the patient was taken to the operating room after appropriate identification and placed on the operating table. She had been treated with 2 g of Ancef intravenously  on call to the operating room.   After the attainment of adequate spinal anesthesia she was placed in the lithotomy position. The perineum and vagina were prepped with multiple layers of Betadine. The bladder was emptied with a an in and out catheter. The perineum was draped in sterile field. A Grave's speculum was placed in the vagina. The cervix was grasped with a single-tooth tenaculum.. The uterus was sounded.  The cervix was then dilated to accommodate the diagnostic hysteroscope. The hysteroscope was used to evaluate all quadrants of the uterus. The above-noted findings were made. A medium curette was then used to direct curettage to the area of apparent lesion. This was  removed from the operative field. All quadrants of the endometrial cavity were then curetted. The hysteroscope was reintroduced with the finding of an elimination of the previously noted collection of endometrial material. All instruments were then removed from the vagina and the patient was awakened from general anesthesia and taken to the recovery room in satisfactory condition having tolerated the procedure well sponge and instrument counts correct.  PLAN OF CARE: Discharge home  PATIENT DISPOSITION:  PACU - hemodynamically stable.   Delay start of Pharmacological VTE agent (>24hrs) due to surgical blood loss or risk of bleeding:  not applicable.  Sequential compression devices were used to remove the procedure.   [name], MD 11:05 AM

## 2012-04-09 ENCOUNTER — Encounter (HOSPITAL_COMMUNITY): Payer: Self-pay | Admitting: Obstetrics and Gynecology

## 2012-04-19 ENCOUNTER — Ambulatory Visit (INDEPENDENT_AMBULATORY_CARE_PROVIDER_SITE_OTHER): Payer: BC Managed Care – PPO | Admitting: Obstetrics and Gynecology

## 2012-04-19 ENCOUNTER — Encounter: Payer: Self-pay | Admitting: Obstetrics and Gynecology

## 2012-04-19 VITALS — BP 130/82 | Temp 98.8°F | Wt 230.0 lb

## 2012-04-19 DIAGNOSIS — N939 Abnormal uterine and vaginal bleeding, unspecified: Secondary | ICD-10-CM

## 2012-04-19 DIAGNOSIS — N926 Irregular menstruation, unspecified: Secondary | ICD-10-CM

## 2012-04-19 DIAGNOSIS — N9489 Other specified conditions associated with female genital organs and menstrual cycle: Secondary | ICD-10-CM

## 2012-04-19 NOTE — Progress Notes (Signed)
DATE OF SURGERY: 04/05/2012 TYPE OF SURGERY:Hysteroscopy D&C  PAIN:Yes some pian in lower abdomin increased with walking.  VAG BLEEDING: no VAG DISCHARGE: no NORMAL GI FUNCTN: yes NORMAL GU FUNCTN: yes  Subjective:     Rebecca Fry is a 27 y.o. female who presents for post-op visit.  Pathology report:  was reviewed with patient. PATHOLOGY REPORT Endometrium, curettage, with mass - POLYPOID PROLIFERATIVE PHASE ENDOMETRIUM WITH BREAKDOWN. - NEGATIVE FOR HYPERPLASIA OR MALIGNANCY. - BENIGN PLACENTAL IMPLANTATION SITE. - BENIGN ENDOCERVICAL MUCOSA. [place] RUND DO Pathologist, Electronic Signature (Case signed 04/06/2012)  The following portions of the patient's history were reviewed and updated as appropriate: allergies, current medications, past family history, past medical history, past social history, past surgical history and problem list.  Review of Systems Pertinent items are noted in HPI.   Objective:    BP 130/82  Temp 98.8 F (37.1 C)  Wt 230 lb (104.327 kg)  LMP 03/27/2012  Breastfeeding? No Weight:  Wt Readings from Last 1 Encounters:  04/19/12 230 lb (104.327 kg)    BMI: There is no height on file to calculate BMI.  General Appearance: Alert, appropriate appearance for age. No acute distress Lungs: clear to auscultation bilaterally Back: No CVA tenderness Cardiovascular: Regular rate and rhythm. S1, S2, no murmur Gastrointestinal: Soft, non-tender, no masses or organomegaly Pelvic Exam: Pelvic exam: normal external genitalia, vulva, vagina, cervix, uterus and adnexa. Assessment:    Doing well postoperatively. Operative findings again reviewed.Probable abnl uterine bleeding from placental site retention, now removed. Requests contraception with IUD, but had recurrent ovarian cysts with Mirena   Plan:  Skyla recommended Condoms till then

## 2012-05-07 ENCOUNTER — Encounter: Payer: Self-pay | Admitting: Obstetrics and Gynecology

## 2012-05-07 ENCOUNTER — Ambulatory Visit: Payer: BC Managed Care – PPO | Admitting: Obstetrics and Gynecology

## 2012-05-07 VITALS — BP 146/70 | Ht 65.5 in | Wt 229.0 lb

## 2012-05-07 DIAGNOSIS — Z309 Encounter for contraceptive management, unspecified: Secondary | ICD-10-CM

## 2012-05-07 DIAGNOSIS — N939 Abnormal uterine and vaginal bleeding, unspecified: Secondary | ICD-10-CM

## 2012-05-07 LAB — POCT URINE PREGNANCY: Preg Test, Ur: NEGATIVE

## 2012-05-07 MED ORDER — LEVONORGESTREL 13.5 MG IU IUD
13.5000 mg | INTRAUTERINE_SYSTEM | Freq: Once | INTRAUTERINE | Status: AC
  Administered 2012-05-07: 13.5 mg via INTRAUTERINE

## 2012-05-07 NOTE — Progress Notes (Signed)
IUD INSERTION  IUD: Skyla LOT#: [id number] Exp: 03/15 UPT: negative GC/CHLAMYDIA: done today CONSENT SIGNED: yes DISINFECTION WITH Betadine X3 UTERUS SOUNDED AT 8CM IUD INSERTED PER PROTOCOL:yes COMPLICATION: No PATIENT INSTRUCTED TO CALL IS FEVER OR ABNORMAL PAIN:yes PATIENT INSTRUCTED ON HOW TO CHECK IUD STRINGS: yes FOLLOW UP APPT: 6 weeks

## 2012-05-08 LAB — GC/CHLAMYDIA PROBE AMP
CT Probe RNA: NEGATIVE
GC Probe RNA: NEGATIVE

## 2012-06-24 ENCOUNTER — Encounter: Payer: BC Managed Care – PPO | Admitting: Obstetrics and Gynecology

## 2013-02-01 ENCOUNTER — Emergency Department (HOSPITAL_COMMUNITY): Payer: BC Managed Care – PPO

## 2013-02-01 ENCOUNTER — Emergency Department (HOSPITAL_COMMUNITY)
Admission: EM | Admit: 2013-02-01 | Discharge: 2013-02-01 | Disposition: A | Discharge status: Nursing Home (Includes ICF) | Payer: BC Managed Care – PPO | Source: Home / Self Care | Attending: Family Medicine | Admitting: Family Medicine

## 2013-02-01 ENCOUNTER — Emergency Department (HOSPITAL_COMMUNITY)
Admission: EM | Admit: 2013-02-01 | Discharge: 2013-02-01 | Disposition: A | Discharge status: Home / Self Care | Payer: BC Managed Care – PPO | Attending: Emergency Medicine | Admitting: Emergency Medicine

## 2013-02-01 ENCOUNTER — Encounter (HOSPITAL_COMMUNITY): Payer: Self-pay | Admitting: Emergency Medicine

## 2013-02-01 DIAGNOSIS — Z87440 Personal history of urinary (tract) infections: Secondary | ICD-10-CM | POA: Insufficient documentation

## 2013-02-01 DIAGNOSIS — Z8619 Personal history of other infectious and parasitic diseases: Secondary | ICD-10-CM | POA: Insufficient documentation

## 2013-02-01 DIAGNOSIS — D649 Anemia, unspecified: Secondary | ICD-10-CM | POA: Insufficient documentation

## 2013-02-01 DIAGNOSIS — Z8669 Personal history of other diseases of the nervous system and sense organs: Secondary | ICD-10-CM | POA: Insufficient documentation

## 2013-02-01 DIAGNOSIS — R079 Chest pain, unspecified: Secondary | ICD-10-CM | POA: Insufficient documentation

## 2013-02-01 DIAGNOSIS — Z885 Allergy status to narcotic agent status: Secondary | ICD-10-CM | POA: Insufficient documentation

## 2013-02-01 DIAGNOSIS — Z8742 Personal history of other diseases of the female genital tract: Secondary | ICD-10-CM | POA: Insufficient documentation

## 2013-02-01 DIAGNOSIS — R0789 Other chest pain: Secondary | ICD-10-CM

## 2013-02-01 DIAGNOSIS — R0602 Shortness of breath: Secondary | ICD-10-CM | POA: Insufficient documentation

## 2013-02-01 DIAGNOSIS — M79609 Pain in unspecified limb: Secondary | ICD-10-CM | POA: Insufficient documentation

## 2013-02-01 DIAGNOSIS — E669 Obesity, unspecified: Secondary | ICD-10-CM | POA: Insufficient documentation

## 2013-02-01 DIAGNOSIS — Z79899 Other long term (current) drug therapy: Secondary | ICD-10-CM | POA: Insufficient documentation

## 2013-02-01 DIAGNOSIS — I10 Essential (primary) hypertension: Secondary | ICD-10-CM | POA: Insufficient documentation

## 2013-02-01 DIAGNOSIS — R609 Edema, unspecified: Secondary | ICD-10-CM | POA: Insufficient documentation

## 2013-02-01 LAB — CBC WITH DIFFERENTIAL/PLATELET
Basophils Absolute: 0 10*3/uL (ref 0.0–0.1)
Eosinophils Absolute: 0.2 10*3/uL (ref 0.0–0.7)
Lymphocytes Relative: 47 % — ABNORMAL HIGH (ref 12–46)
Lymphs Abs: 3.4 10*3/uL (ref 0.7–4.0)
MCH: 27.2 pg (ref 26.0–34.0)
MCHC: 33.5 g/dL (ref 30.0–36.0)
MCV: 81.1 fL (ref 78.0–100.0)
Neutro Abs: 3.2 10*3/uL (ref 1.7–7.7)
Neutrophils Relative %: 45 % (ref 43–77)
Platelets: 382 10*3/uL (ref 150–400)
RBC: 4.97 MIL/uL (ref 3.87–5.11)
RDW: 13.7 % (ref 11.5–15.5)
WBC: 7.3 10*3/uL (ref 4.0–10.5)

## 2013-02-01 LAB — COMPREHENSIVE METABOLIC PANEL
ALT: 16 U/L (ref 0–35)
AST: 16 U/L (ref 0–37)
Alkaline Phosphatase: 62 U/L (ref 39–117)
CO2: 25 mEq/L (ref 19–32)
Chloride: 101 mEq/L (ref 96–112)
GFR calc Af Amer: >90 mL/min (ref >90)
GFR calc non Af Amer: >90 mL/min (ref >90)
Glucose, Bld: 84 mg/dL (ref 70–99)
Potassium: 3.7 mEq/L (ref 3.5–5.1)
Sodium: 139 mEq/L (ref 135–145)
Total Bilirubin: 0.2 mg/dL — ABNORMAL LOW (ref 0.3–1.2)

## 2013-02-01 MED ORDER — ASPIRIN 81 MG PO CHEW
CHEWABLE_TABLET | ORAL | Status: AC
  Filled 2013-02-01: qty 4

## 2013-02-01 MED ORDER — NITROGLYCERIN 0.4 MG SL SUBL
0.4000 mg | SUBLINGUAL_TABLET | SUBLINGUAL | Status: DC | PRN
  Administered 2013-02-01: 0.4 mg via SUBLINGUAL

## 2013-02-01 MED ORDER — NITROGLYCERIN 0.4 MG SL SUBL
SUBLINGUAL_TABLET | SUBLINGUAL | Status: AC
  Filled 2013-02-01: qty 25

## 2013-02-01 MED ORDER — ASPIRIN 81 MG PO CHEW
324.0000 mg | CHEWABLE_TABLET | Freq: Once | ORAL | Status: AC
  Administered 2013-02-01: 324 mg via ORAL

## 2013-02-01 MED ORDER — SODIUM CHLORIDE 0.9 % IV SOLN: Freq: Once | INTRAVENOUS | Status: DC

## 2013-02-01 NOTE — ED Provider Notes (Signed)
CSN: [id number]     Arrival date & time 02/01/13  1940 History   First MD Initiated Contact with Patient 02/01/13 1956     Chief Complaint  Patient presents with  . Chest Pain   (Consider location/radiation/quality/duration/timing/severity/associated sxs/prior Treatment) HPI Comments: This is a 27 year old, obese, African American female, with a history of hypertension, and has been well controlled until about 3, weeks, ago.  She was seen by her primary care physician, and no medication changes were made.  She's also been prediabetic with an A1c between 6 and 7, who has a non-hormone, including IUD in place for the past several years.  Presents today with intermittent left chest pain, radiating to her left arm, 3-4 episodes over the last 7 days.  She states when the pain is sharp.  It lasts for several seconds.  When the pain is colic can last for several hours.  She has not tried any kind of therapy to alleviate the pain.  She does report that when the pain lasts for several hours.  She becomes slightly short of breath, but denies nausea, or diaphoresis.  She reports, that for the last several, years.  She's had some foot, and ankle swelling, but has been reassured by her primary care physician, that her antihypertensive.  Also contains a "water pill". She denies any recent travel, surgery smoking history of DVT.  Family history of clotting disorders, recent illnesses.  Patient is a 27 y.o. female presenting with chest pain. The history is provided by the patient.  Chest Pain Pain location:  L chest Pain quality: aching and sharp   Pain radiates to:  L arm Pain radiates to the back: no   Pain severity:  Mild Onset quality:  Unable to specify Duration:  7 days Timing:  Intermittent Progression:  Worsening Context: at rest   Context: no drug use, not eating, not lifting, no movement and not raising an arm   Relieved by:  None tried Ineffective treatments:  None tried Associated symptoms:  lower extremity edema and shortness of breath   Associated symptoms: no abdominal pain, no anxiety, no back pain, no cough, no dizziness, no fever, no headache, no heartburn, no nausea and not vomiting   Risk factors: birth control, hypertension and obesity   Risk factors: no coronary artery disease, no diabetes mellitus, no prior DVT/PE and no smoking     Past Medical History  Diagnosis Date  . Migraine   . Bladder infection   . Anemia   . Hypertension   . History of chicken pox   . Yeast infection   . Chlamydia   . Amenorrhea   . SAB (spontaneous abortion)   . Ovarian cyst   . BV (bacterial vaginosis)   . Abnormal Pap smear     colpo  . PONV (postoperative nausea and vomiting)     after surgery in 2009-nausea & vomiting   Past Surgical History  Procedure Laterality Date  . Tonsilectomy, adenoidectomy, bilateral myringotomy and tubes    . Lipomas removal  2009  . Dilation and curettage of uterus  2007  . Hysteroscopy w/d&c  04/05/2012    Procedure: DILATATION AND CURETTAGE /HYSTEROSCOPY;  Surgeon: [name], MD;  Location: [facility];  Service: Gynecology;  Laterality: N/A;   Family History  Problem Relation Age of Onset  . Heart disease Paternal Grandfather   . Heart disease Father   . Hypertension Father   . Diabetes Father   . Irritable bowel syndrome Paternal  Grandmother   . Thyroid disease Paternal Grandmother   . Hypertension Mother   . Anemia Mother   . Diabetes Maternal Aunt   . Diabetes Paternal Uncle    History  Substance Use Topics  . Smoking status: Never Smoker   . Smokeless tobacco: Never Used  . Alcohol Use: No   OB History   Grav Para Term Preterm Abortions TAB SAB Ect Mult Living   6 3   3 2 1   3      Review of Systems  Constitutional: Negative for fever.  Respiratory: Positive for shortness of breath. Negative for cough.   Cardiovascular: Positive for chest pain and leg swelling.  Gastrointestinal: Negative for heartburn, nausea,  vomiting, abdominal pain, diarrhea and constipation.  Endocrine: Negative for polydipsia, polyphagia and polyuria.  Genitourinary: Negative for dysuria and vaginal discharge.  Musculoskeletal: Negative for back pain and myalgias.  Skin: Negative for rash.  Neurological: Negative for dizziness, light-headedness and headaches.  All other systems reviewed and are negative.    Allergies  Hydrocodone  Home Medications   Current Outpatient Rx  Name  Route  Sig  Dispense  Refill  . fexofenadine-pseudoephedrine (ALLEGRA-D) 60-120 MG per tablet   Oral   Take 1 tablet by mouth 2 (two) times daily as needed (nasal congestion).         Marland Kitchen ibuprofen (ADVIL,MOTRIN) 800 MG tablet   Oral   Take 800 mg by mouth every 8 (eight) hours as needed for pain.         . Multiple Vitamins-Minerals (MULTIVITAMIN PO)   Oral   Take 1 tablet by mouth daily.         . Olmesartan-Amlodipine-HCTZ 40-10-25 MG TABS   Oral   Take 1 tablet by mouth daily. Tribenzor          BP 145/79  Pulse 86  Temp(Src) 98.2 F (36.8 C) (Oral)  Resp 21  SpO2 100%  LMP 01/28/2013 Physical Exam  Nursing note and vitals reviewed. Constitutional: She appears well-developed and well-nourished. No distress.  HENT:  Head: Normocephalic and atraumatic.  Cardiovascular: Normal rate and regular rhythm.   Pulmonary/Chest: Effort normal and breath sounds normal. She has no wheezes.  Abdominal: Soft. Bowel sounds are normal. She exhibits no distension. There is no tenderness.  Musculoskeletal: She exhibits edema. She exhibits no tenderness.  Neurological: She is alert.  Skin: Skin is warm. No rash noted.    ED Course  Procedures (including critical care time) Labs Review Labs Reviewed  CBC WITH DIFFERENTIAL - Abnormal; Notable for the following:    Lymphocytes Relative 47 (*)    All other components within normal limits  COMPREHENSIVE METABOLIC PANEL - Abnormal; Notable for the following:    Total Bilirubin 0.2  (*)    All other components within normal limits  TROPONIN I  D-DIMER, QUANTITATIVE   Imaging Review Dg Chest 2 View  02/01/2013   CLINICAL DATA:  Chest pain.  EXAM: CHEST  2 VIEW  COMPARISON:  November 03, 2011.  FINDINGS: The heart size and mediastinal contours are within normal limits. Both lungs are clear. The visualized skeletal structures are unremarkable.  IMPRESSION: No active cardiopulmonary disease.   Electronically Signed   By: Roque Lias M.D.   On: 02/01/2013 20:44    EKG Interpretation   None      Date: 02/01/2013  Rate: 83  Rhythm: normal sinus rhythm  QRS Axis: normal  Intervals: normal  ST/T Wave abnormalities: nonspecific T wave changes  Conduction Disutrbances:none  Narrative Interpretation: borderline  Old EKG Reviewed: unchanged   MDM   1. Nonspecific chest pain     Labs chest x-ray, EKG, reviewed all within normal parameters, not indicating a cause for this patient's discomfort, but it reassuring that she is not having a heart attack.  A pulmonary embolus, gallbladder issues.  She'll be discharged home to followup with her primary care physician.  This has been discussed with her and her husband and she is comfortable with this decision    Arman Filter, NP 02/01/13 2210

## 2013-02-01 NOTE — ED Notes (Signed)
C/o chest pain that radiates to the left arm. Denies sob, n/v. States had an episode of profuse sweating. Having headache and blurred vision.  Hx of hypertension.  Pt is sitting up right alert and oriented. No signs of distress.

## 2013-02-01 NOTE — ED Notes (Signed)
Pt states she is here from chest pain that has been occurring off and on for two weeks now to the left chest radiating to the left arm. Denies SOB/nausea, reports some sweating. Pain 3/10 at this time. Pt sat 100% on room air.

## 2013-02-01 NOTE — ED Notes (Signed)
Pt with chest pain x 1 week; radiation to left arm; pain worse today; pt reporting some diaphoresis. Pt given 1 nitro; pain decreased from 7 to 4; pt on 2L O2; NSR on the monitor.  Pt given 324 of ASA PTA at urgent care.

## 2013-02-01 NOTE — ED Provider Notes (Signed)
CSN: [id number]     Arrival date & time 02/01/13  1812 History   First MD Initiated Contact with Patient 02/01/13 1843     Chief Complaint  Patient presents with  . Chest Pain   (Consider location/radiation/quality/duration/timing/severity/associated sxs/prior Treatment) HPI Comments: 27 yo AA over weight prediabetic, hypertensive female presents with chest pain with radiation to left arm x 1 week on/ off. Patient notes feels arm is numb, tingling and almost swollen or heavy.   Patient is a 27 y.o. female presenting with chest pain.  Chest Pain Associated symptoms: fatigue     Past Medical History  Diagnosis Date  . Migraine   . Bladder infection   . Anemia   . Hypertension   . History of chicken pox   . Yeast infection   . Chlamydia   . Amenorrhea   . SAB (spontaneous abortion)   . Ovarian cyst   . BV (bacterial vaginosis)   . Abnormal Pap smear     colpo  . PONV (postoperative nausea and vomiting)     after surgery in 2009-nausea & vomiting   Past Surgical History  Procedure Laterality Date  . Tonsilectomy, adenoidectomy, bilateral myringotomy and tubes    . Lipomas removal  2009  . Dilation and curettage of uterus  2007  . Hysteroscopy w/d&c  04/05/2012    Procedure: DILATATION AND CURETTAGE /HYSTEROSCOPY;  Surgeon: [name], MD;  Location: [facility];  Service: Gynecology;  Laterality: N/A;   Family History  Problem Relation Age of Onset  . Heart disease Paternal Grandfather   . Heart disease Father   . Hypertension Father   . Diabetes Father   . Irritable bowel syndrome Paternal Grandmother   . Thyroid disease Paternal Grandmother   . Hypertension Mother   . Anemia Mother   . Diabetes Maternal Aunt   . Diabetes Paternal Uncle    History  Substance Use Topics  . Smoking status: Never Smoker   . Smokeless tobacco: Never Used  . Alcohol Use: No   OB History   Grav Para Term Preterm Abortions TAB SAB Ect Mult Living   6 3   3 2 1   3      Review  of Systems  Constitutional: Positive for fatigue.  Cardiovascular: Positive for chest pain.  Musculoskeletal: Positive for myalgias and neck pain.  Psychiatric/Behavioral: Negative.     Allergies  Review of patient's allergies indicates no known allergies.  Home Medications   Current Outpatient Rx  Name  Route  Sig  Dispense  Refill  . Olmesartan-Amlodipine-HCTZ 40-10-25 MG TABS   Oral   Take 1 tablet by mouth. Tribenzor         . ibuprofen (ADVIL,MOTRIN) 600 MG tablet   Oral   Take 1 tablet (600 mg total) by mouth every 6 (six) hours.   60 tablet   2     Every 6 hours for the first 3 days and then every  ...   . promethazine (PHENERGAN) 6.25 MG/5ML syrup   Oral   Take 10 mLs (12.5 mg total) by mouth 4 (four) times daily as needed (Every 6 hours as needed for cough).   240 mL   0    BP 142/76  Pulse 87  Temp(Src) 98.2 F (36.8 C) (Oral)  Resp 16  SpO2 100%  LMP 01/28/2013  Breastfeeding? No Physical Exam  Nursing note and vitals reviewed. Constitutional: She appears well-developed and well-nourished.  Cardiovascular: Normal rate, regular rhythm, normal heart  sounds and intact distal pulses.   Pulmonary/Chest: Effort normal and breath sounds normal.  Neurological: She is alert.  Psychiatric: Judgment normal.    ED Course  Procedures (including critical care time) Labs Review Labs Reviewed - No data to display Imaging Review No results found.    MDM  PATIENT BEING TRANSFERRED TO ER FOR FURTHER EVALUATION DUE TO CP history with radiation to Left arm with numbness/ tingling. Patient with fatigue x 1 week and fMHX + for MI at age 65 in grandparent. EKG was WNL    Berenice Primas, PA-C 02/01/13 1858

## 2013-02-02 NOTE — ED Provider Notes (Signed)
Medical screening examination/treatment/procedure(s) were performed by non-physician practitioner and as supervising physician I was immediately available for consultation/collaboration.  EKG Interpretation   None        Raeford Razor, MD 02/02/13 0021

## 2013-02-02 NOTE — ED Provider Notes (Signed)
Medical screening examination/treatment/procedure(s) were performed by resident physician or non-physician practitioner and as supervising physician I was immediately available for consultation/collaboration.   Barkley Bruns MD.   Linna Hoff, MD 02/02/13 2114

## 2013-04-14 HISTORY — PX: WISDOM TOOTH EXTRACTION: SHX21

## 2013-09-10 ENCOUNTER — Emergency Department (HOSPITAL_COMMUNITY)
Admission: EM | Admit: 2013-09-10 | Discharge: 2013-09-10 | Disposition: A | Discharge status: Home / Self Care | Payer: BC Managed Care – PPO | Attending: Emergency Medicine | Admitting: Emergency Medicine

## 2013-09-10 ENCOUNTER — Encounter (HOSPITAL_COMMUNITY): Payer: Self-pay | Admitting: Emergency Medicine

## 2013-09-10 DIAGNOSIS — Z8619 Personal history of other infectious and parasitic diseases: Secondary | ICD-10-CM | POA: Insufficient documentation

## 2013-09-10 DIAGNOSIS — G43909 Migraine, unspecified, not intractable, without status migrainosus: Secondary | ICD-10-CM | POA: Insufficient documentation

## 2013-09-10 DIAGNOSIS — Z87448 Personal history of other diseases of urinary system: Secondary | ICD-10-CM | POA: Insufficient documentation

## 2013-09-10 DIAGNOSIS — K08109 Complete loss of teeth, unspecified cause, unspecified class: Secondary | ICD-10-CM | POA: Insufficient documentation

## 2013-09-10 DIAGNOSIS — Z862 Personal history of diseases of the blood and blood-forming organs and certain disorders involving the immune mechanism: Secondary | ICD-10-CM | POA: Insufficient documentation

## 2013-09-10 DIAGNOSIS — I10 Essential (primary) hypertension: Secondary | ICD-10-CM | POA: Insufficient documentation

## 2013-09-10 DIAGNOSIS — Z792 Long term (current) use of antibiotics: Secondary | ICD-10-CM | POA: Insufficient documentation

## 2013-09-10 DIAGNOSIS — Z8742 Personal history of other diseases of the female genital tract: Secondary | ICD-10-CM | POA: Insufficient documentation

## 2013-09-10 DIAGNOSIS — Y836 Removal of other organ (partial) (total) as the cause of abnormal reaction of the patient, or of later complication, without mention of misadventure at the time of the procedure: Secondary | ICD-10-CM | POA: Insufficient documentation

## 2013-09-10 DIAGNOSIS — Z79899 Other long term (current) drug therapy: Secondary | ICD-10-CM | POA: Insufficient documentation

## 2013-09-10 DIAGNOSIS — IMO0002 Reserved for concepts with insufficient information to code with codable children: Secondary | ICD-10-CM

## 2013-09-10 NOTE — ED Provider Notes (Signed)
CSN: [id number]     Arrival date & time 09/10/13  3343 History   First MD Initiated Contact with Patient 09/10/13 0402     Chief Complaint  Patient presents with  . Post-op Problem     (Consider location/radiation/quality/duration/timing/severity/associated sxs/prior Treatment) HPI Patient had all 4 wisdom teeth removed yesterday. She noted intraoral bleeding this morning when waking around 3 AM. She's been unable to control the bleeding at home with pressure. She denies any fevers or chills. She denies any intraoral swelling. Pain is controlled. Past Medical History  Diagnosis Date  . Migraine   . Bladder infection   . Anemia   . Hypertension   . History of chicken pox   . Yeast infection   . Chlamydia   . Amenorrhea   . SAB (spontaneous abortion)   . Ovarian cyst   . BV (bacterial vaginosis)   . Abnormal Pap smear     colpo  . PONV (postoperative nausea and vomiting)     after surgery in 2009-nausea & vomiting   Past Surgical History  Procedure Laterality Date  . Tonsilectomy, adenoidectomy, bilateral myringotomy and tubes    . Lipomas removal  2009  . Dilation and curettage of uterus  2007  . Hysteroscopy w/d&c  04/05/2012    Procedure: DILATATION AND CURETTAGE /HYSTEROSCOPY;  Surgeon: [name], MD;  Location: [facility];  Service: Gynecology;  Laterality: N/A;   Family History  Problem Relation Age of Onset  . Heart disease Paternal Grandfather   . Heart disease Father   . Hypertension Father   . Diabetes Father   . Irritable bowel syndrome Paternal Grandmother   . Thyroid disease Paternal Grandmother   . Hypertension Mother   . Anemia Mother   . Diabetes Maternal Aunt   . Diabetes Paternal Uncle    History  Substance Use Topics  . Smoking status: Never Smoker   . Smokeless tobacco: Never Used  . Alcohol Use: No   OB History   Grav Para Term Preterm Abortions TAB SAB Ect Mult Living   6 3   3 2 1   3      Review of Systems  Constitutional:  Negative for fever and chills.  HENT: Positive for dental problem. Negative for facial swelling.   All other systems reviewed and are negative.     Allergies  Hydrocodone  Home Medications   Prior to Admission medications   Medication Sig Start Date End Date Taking? Authorizing Provider  amoxicillin (AMOXIL) 500 MG tablet Take 500 mg by mouth every 8 (eight) hours. Patient has 1 day remaining on therapy 08/30/13 09/10/13 Yes Historical Provider, MD  fexofenadine-pseudoephedrine (ALLEGRA-D) 60-120 MG per tablet Take 1 tablet by mouth 2 (two) times daily as needed (nasal congestion).   Yes Historical Provider, MD  HYDROcodone-acetaminophen (NORCO) 7.5-325 MG per tablet Take 1 tablet by mouth every 6 (six) hours as needed for moderate pain.   Yes Historical Provider, MD  ibuprofen (ADVIL,MOTRIN) 800 MG tablet Take 800 mg by mouth every 8 (eight) hours as needed for pain.   Yes Historical Provider, MD  ketorolac (TORADOL) 10 MG tablet Take 10 mg by mouth 4 (four) times daily.   Yes Historical Provider, MD  Multiple Vitamins-Minerals (MULTIVITAMIN PO) Take 1 tablet by mouth daily.   Yes Historical Provider, MD  Olmesartan-Amlodipine-HCTZ 40-10-25 MG TABS Take 1 tablet by mouth daily. Tribenzor   Yes Historical Provider, MD  traMADol (ULTRAM) 50 MG tablet Take 50 mg by mouth every  6 (six) hours as needed. For pain 09/01/13  Yes Historical Provider, MD   BP 152/97  Pulse 82  Resp 18  SpO2 98%  LMP 09/03/2013 Physical Exam  Constitutional: She is oriented to person, place, and time. She appears well-developed and well-nourished. No distress.  HENT:  Head: Normocephalic and atraumatic.  Mouth/Throat: No oropharyngeal exudate.  Third molars removed. Clot formation noted. No active bleeding. No evidence of intraoral swelling or abscess.  Neck: Normal range of motion. Neck supple.  Cardiovascular: Normal rate.   Pulmonary/Chest: Effort normal.  Musculoskeletal: Normal range of motion.   Neurological: She is alert and oriented to person, place, and time.  Skin: Skin is warm and dry. No rash noted. No erythema. No pallor.  Psychiatric: She has a normal mood and affect. Her behavior is normal.    ED Course  Procedures (including critical care time) Labs Review Labs Reviewed - No data to display  Imaging Review No results found.   EKG Interpretation None      MDM   Final diagnoses:  None    Gauze placed the patient advised to hold pressure continuously. We'll reassess but anticipate discharge home.  Patient with very small amount of blood on the gauze. Discharge home and recommend followup with her oral surgeon. Return precautions given.  Loren Raceravid Deniel Mcquiston, MD 09/10/13 [phone]

## 2013-09-10 NOTE — Discharge Instructions (Signed)
Follow up with your oral surgeon.

## 2013-09-10 NOTE — ED Notes (Signed)
Pt arrived to the ED with a complaint of oral bleeding.  Pt had her wisdom teeth removed yesterday and woke up this am with bleeding.  Pt is unable to talk due to swelling and pain.  Pt had all four wisdom teeth removed

## 2013-09-10 NOTE — ED Notes (Addendum)
Patient given gauze for bleeding. Patient called dentist prior to coming to ED. She was advised to try tea bag at the sites to stop bleeding and this did not help. She states her mouth continues to fill with blood.

## 2013-12-13 IMAGING — CR DG CHEST 2V
2 series · 2 of 2 positions shown · non-contrast
Comparison: November 03, 2011.

CLINICAL DATA: Chest pain.

EXAM:
CHEST  2 VIEW

[w chest pa]
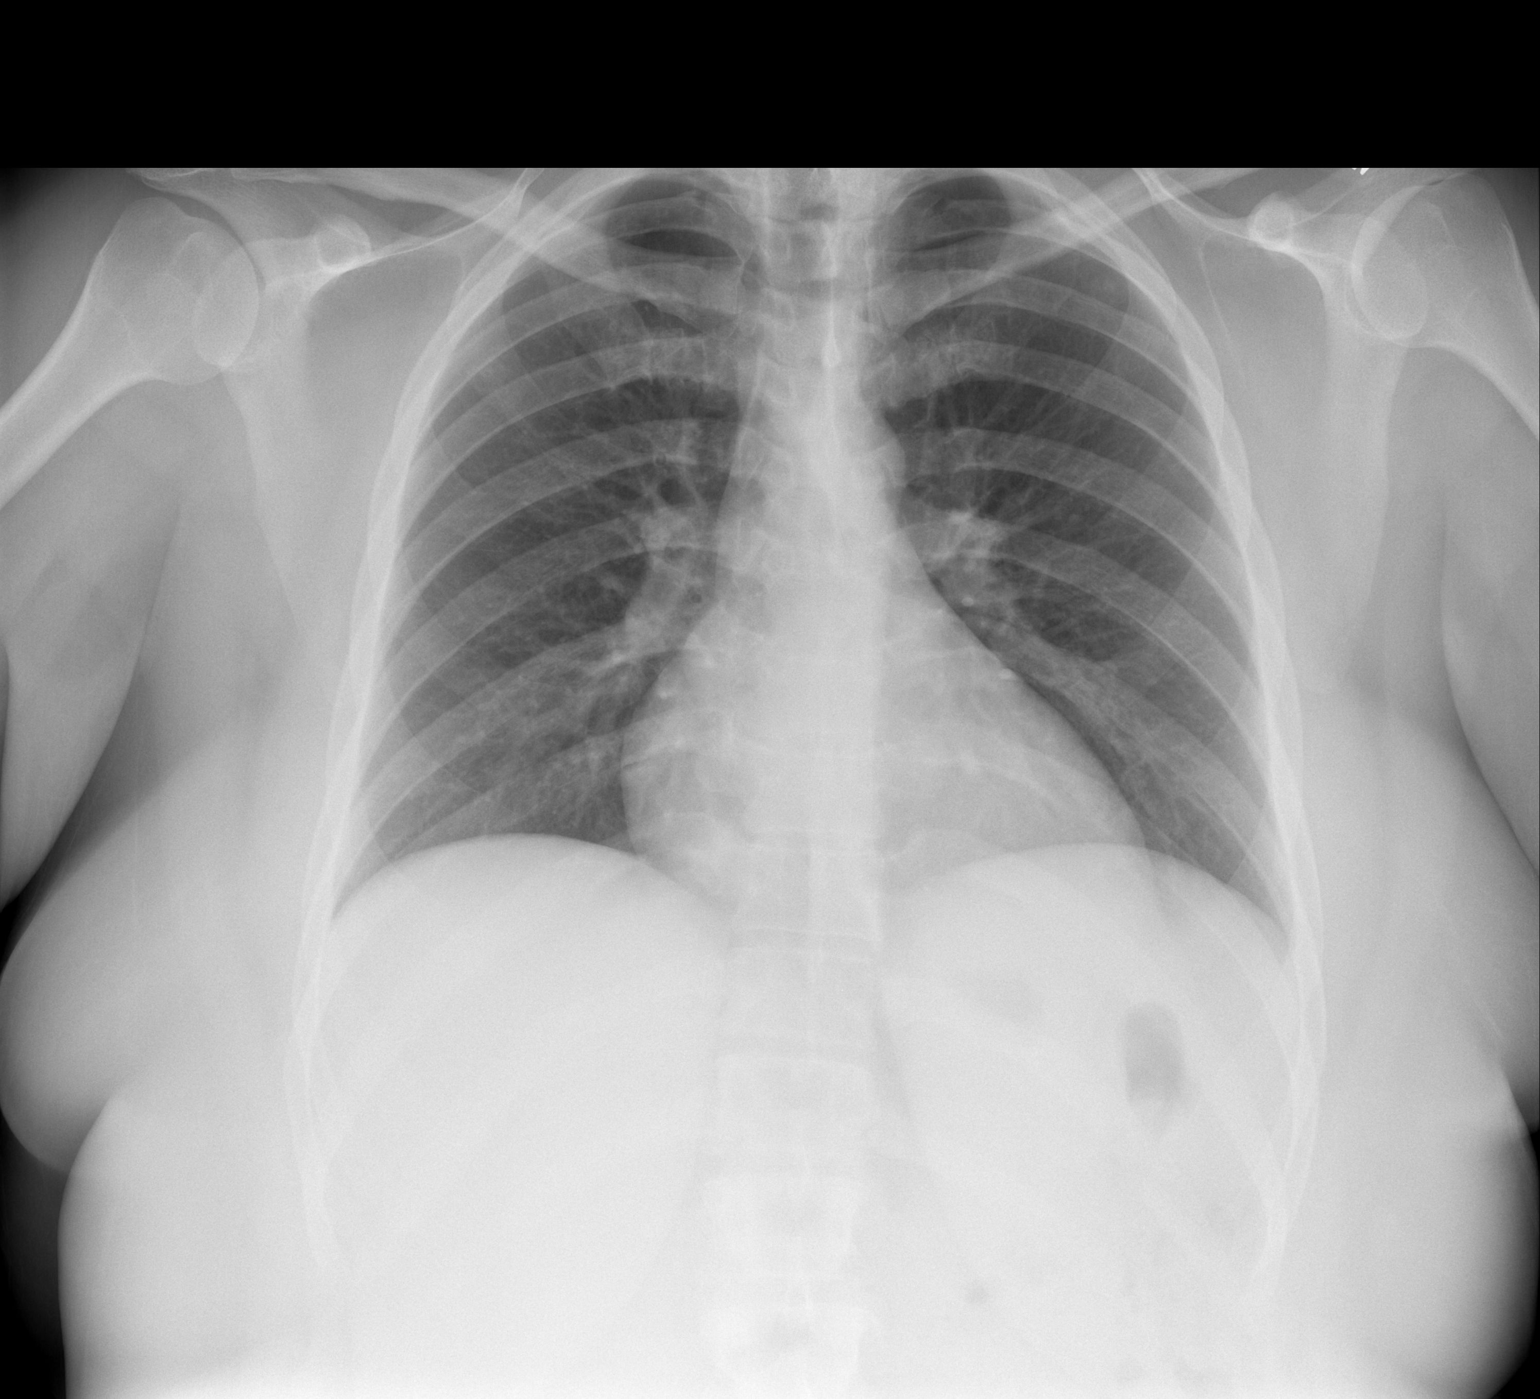

[w chest lat]
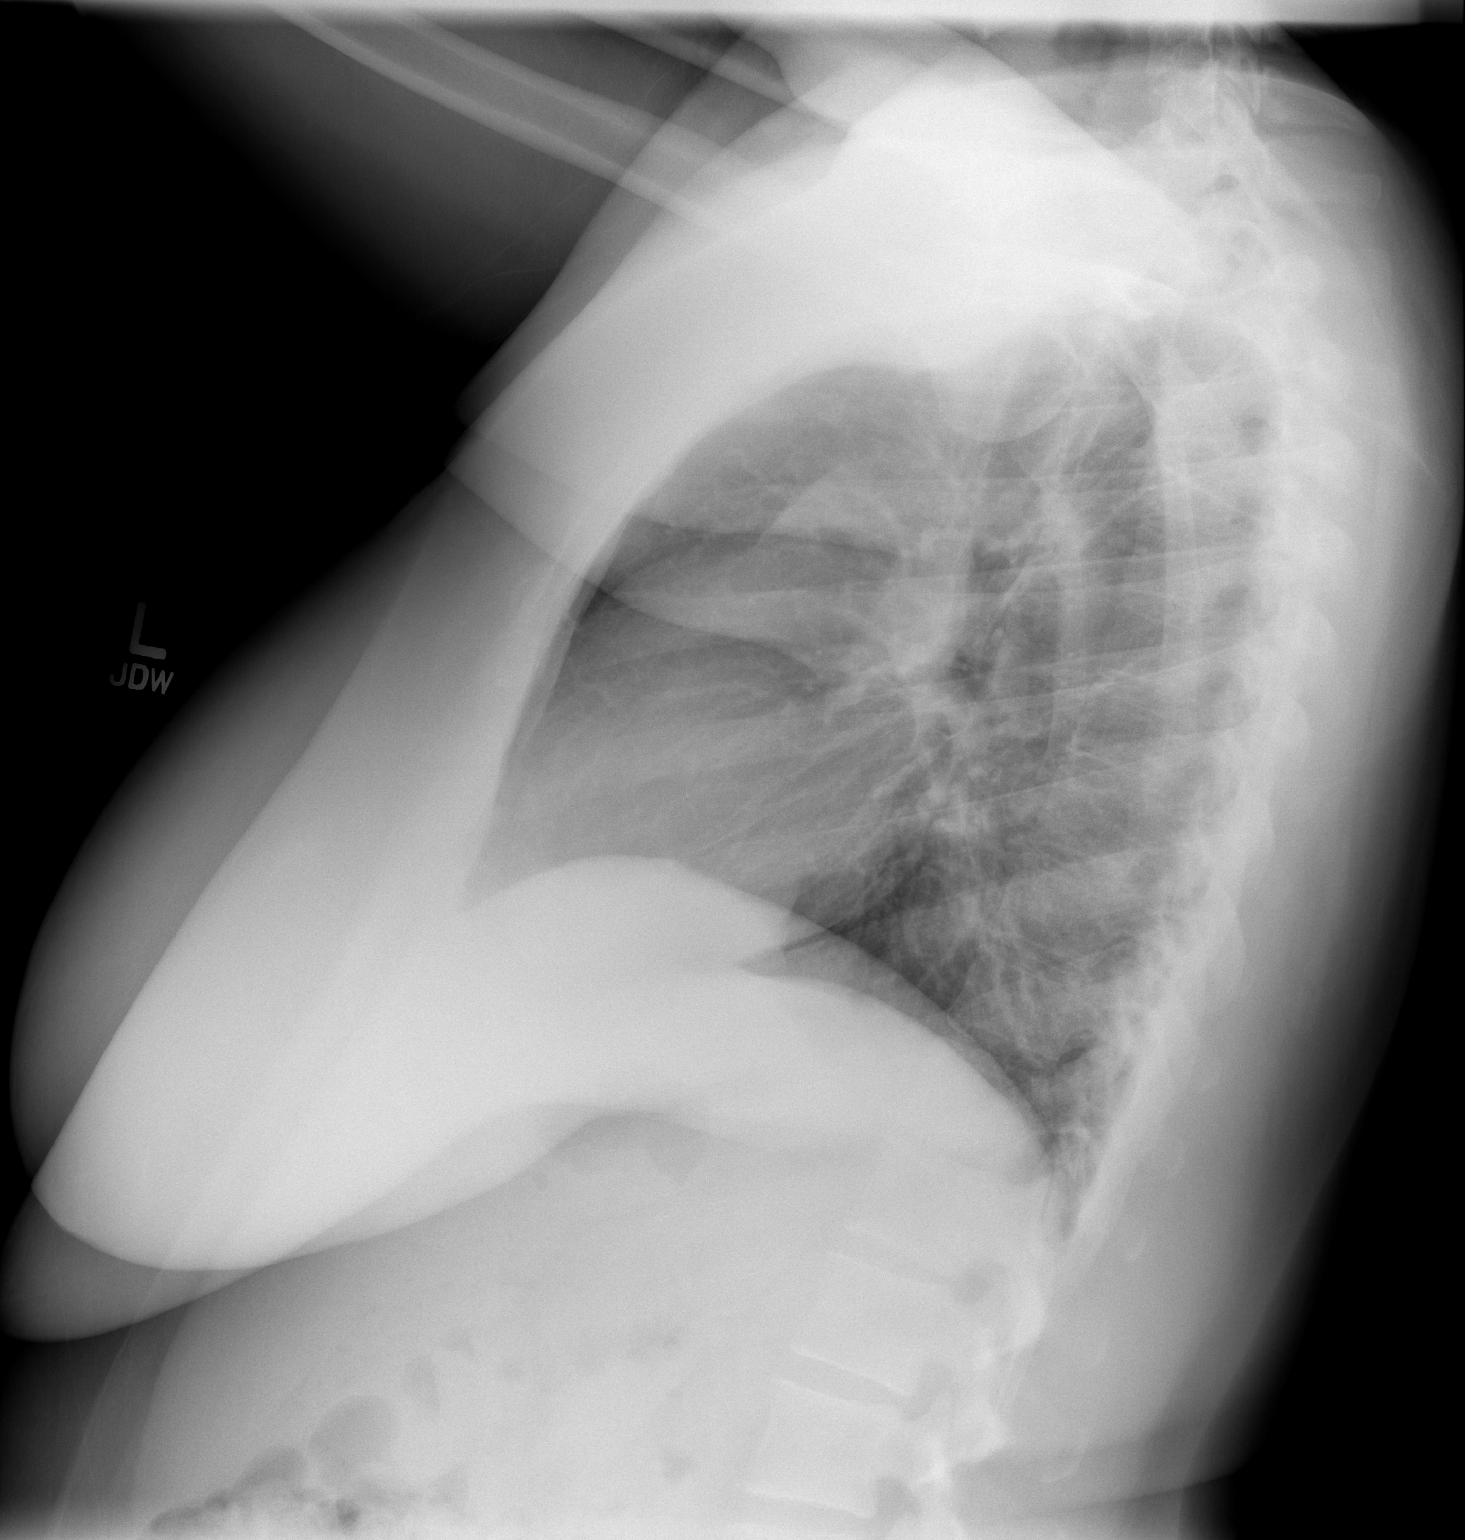

[2 of 2 positions shown; findings below may reference images not displayed]

FINDINGS: The heart size and mediastinal contours are within normal limits.
Both lungs are clear. The visualized skeletal structures are
unremarkable.
IMPRESSION: No active cardiopulmonary disease.

## 2014-02-13 ENCOUNTER — Encounter (HOSPITAL_COMMUNITY): Payer: Self-pay | Admitting: Emergency Medicine

## 2014-03-07 ENCOUNTER — Emergency Department (HOSPITAL_COMMUNITY)
Admission: EM | Admit: 2014-03-07 | Discharge: 2014-03-08 | Disposition: A | Discharge status: Home / Self Care | Payer: BC Managed Care – PPO | Attending: Emergency Medicine | Admitting: Emergency Medicine

## 2014-03-07 ENCOUNTER — Emergency Department (HOSPITAL_COMMUNITY): Payer: BC Managed Care – PPO

## 2014-03-07 ENCOUNTER — Encounter (HOSPITAL_COMMUNITY): Payer: Self-pay | Admitting: Emergency Medicine

## 2014-03-07 DIAGNOSIS — Z8619 Personal history of other infectious and parasitic diseases: Secondary | ICD-10-CM | POA: Insufficient documentation

## 2014-03-07 DIAGNOSIS — D649 Anemia, unspecified: Secondary | ICD-10-CM | POA: Insufficient documentation

## 2014-03-07 DIAGNOSIS — I10 Essential (primary) hypertension: Secondary | ICD-10-CM | POA: Insufficient documentation

## 2014-03-07 DIAGNOSIS — R079 Chest pain, unspecified: Secondary | ICD-10-CM | POA: Diagnosis present

## 2014-03-07 DIAGNOSIS — Z79899 Other long term (current) drug therapy: Secondary | ICD-10-CM | POA: Insufficient documentation

## 2014-03-07 DIAGNOSIS — Z8742 Personal history of other diseases of the female genital tract: Secondary | ICD-10-CM | POA: Diagnosis not present

## 2014-03-07 LAB — CBC WITH DIFFERENTIAL/PLATELET
Basophils Absolute: 0 10*3/uL (ref 0.0–0.1)
Basophils Relative: 1 % (ref 0–1)
Eosinophils Absolute: 0.3 10*3/uL (ref 0.0–0.7)
Eosinophils Relative: 4 % (ref 0–5)
HEMATOCRIT: 40.3 % (ref 36.0–46.0)
Hemoglobin: 13 g/dL (ref 12.0–15.0)
LYMPHS ABS: 4.1 10*3/uL — AB (ref 0.7–4.0)
LYMPHS PCT: 46 % (ref 12–46)
MCH: 26.6 pg (ref 26.0–34.0)
MCHC: 32.3 g/dL (ref 30.0–36.0)
MCV: 82.6 fL (ref 78.0–100.0)
MONO ABS: 0.5 10*3/uL (ref 0.1–1.0)
Monocytes Relative: 5 % (ref 3–12)
Neutro Abs: 3.8 10*3/uL (ref 1.7–7.7)
Neutrophils Relative %: 44 % (ref 43–77)
Platelets: 393 10*3/uL (ref 150–400)
RBC: 4.88 MIL/uL (ref 3.87–5.11)
RDW: 13.7 % (ref 11.5–15.5)
WBC: 8.7 10*3/uL (ref 4.0–10.5)

## 2014-03-07 LAB — COMPREHENSIVE METABOLIC PANEL
ALBUMIN: 4.2 g/dL (ref 3.5–5.2)
ALT: 16 U/L (ref 0–35)
AST: 14 U/L (ref 0–37)
Alkaline Phosphatase: 56 U/L (ref 39–117)
Anion gap: 16 — ABNORMAL HIGH (ref 5–15)
BUN: 15 mg/dL (ref 6–23)
CHLORIDE: 100 meq/L (ref 96–112)
CO2: 23 mEq/L (ref 19–32)
Calcium: 9.7 mg/dL (ref 8.4–10.5)
Creatinine, Ser: 0.73 mg/dL (ref 0.50–1.10)
GFR calc Af Amer: >90 mL/min (ref >90)
GFR calc non Af Amer: >90 mL/min (ref >90)
Glucose, Bld: 89 mg/dL (ref 70–99)
POTASSIUM: 4.1 meq/L (ref 3.7–5.3)
Sodium: 139 mEq/L (ref 137–147)
Total Protein: 8.3 g/dL (ref 6.0–8.3)

## 2014-03-07 LAB — D-DIMER, QUANTITATIVE: D-Dimer, Quant: <0.27 ug/mL-FEU (ref 0.00–0.48)

## 2014-03-07 LAB — MAGNESIUM: Magnesium: 2 mg/dL (ref 1.5–2.5)

## 2014-03-07 MED ORDER — KETOROLAC TROMETHAMINE 30 MG/ML IJ SOLN
30.0000 mg | Freq: Once | INTRAMUSCULAR | Status: AC
  Administered 2014-03-07: 30 mg via INTRAVENOUS
  Filled 2014-03-07: qty 1

## 2014-03-07 NOTE — ED Provider Notes (Signed)
CSN: [id number]     Arrival date & time 03/07/14  2147 History   First MD Initiated Contact with Patient 03/07/14 2200     Chief Complaint  Patient presents with  . Chest Pain  . Palpitations     (Consider location/radiation/quality/duration/timing/severity/associated sxs/prior Treatment) HPI  Patient reports she's been having chest discomfort off and on but started 5 days ago. The pain is in her left central left sided chest and is described as sharp. She states she feels like her heart is racing and beating hard and she gets the pain. She states the pain can last for a few hours. She states she is checked her blood pressure and pulse during these episodes and her heart rate has been 82 and 84. She feels short of breath with it and gets diaphoretic. She has had nausea without vomiting. She denies being under any extra stress. She states nothing she does makes the pain come on. She states taking deep breaths and relaxing makes it go away. She denies any pain in her legs but has had some mild swelling of her feet. She denies any coughing. She states she's been having these pains off and on for the past year. Her primary care doctor is done EKG twice, with possibly something seen on the first EKG but improved on the second.  FH PGM died suddenly age 30 from ? CAD, PGF died age 30 suddenly from ? CAD Father has had a heart murmur since birth Brother has a heart murmur also  Past Medical History  Diagnosis Date  . Migraine   . Bladder infection   . Anemia   . Hypertension   . History of chicken pox   . Yeast infection   . Chlamydia   . Amenorrhea   . SAB (spontaneous abortion)   . Ovarian cyst   . BV (bacterial vaginosis)   . Abnormal Pap smear     colpo  . PONV (postoperative nausea and vomiting)     after surgery in 2009-nausea & vomiting   Past Surgical History  Procedure Laterality Date  . Tonsilectomy, adenoidectomy, bilateral myringotomy and tubes    . Lipomas removal  2009    . Dilation and curettage of uterus  2007  . Hysteroscopy w/d&c  04/05/2012    Procedure: DILATATION AND CURETTAGE /HYSTEROSCOPY;  Surgeon: Hal MoralesVanessa P Haygood, MD;  Location: [facility];  Service: Gynecology;  Laterality: N/A;   Family History  Problem Relation Age of Onset  . Heart disease Paternal Grandfather   . Heart disease Father   . Hypertension Father   . Diabetes Father   . Irritable bowel syndrome Paternal Grandmother   . Thyroid disease Paternal Grandmother   . Hypertension Mother   . Anemia Mother   . Diabetes Maternal Aunt   . Diabetes Paternal Uncle    History  Substance Use Topics  . Smoking status: Never Smoker   . Smokeless tobacco: Never Used  . 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     Allergies  Hydrocodone  Home Medications   Prior to Admission medications   Medication Sig Start Date End Date Taking? Authorizing Provider  fexofenadine-pseudoephedrine (ALLEGRA-D) 60-120 MG per tablet Take 1 tablet by  mouth 2 (two) times daily as needed (nasal congestion).    Historical Provider, MD  HYDROcodone-acetaminophen (NORCO) 7.5-325 MG per tablet Take 1 tablet by mouth every 6 (six) hours as needed for moderate pain.    Historical Provider, MD  ibuprofen (ADVIL,MOTRIN) 800 MG tablet Take 800 mg by mouth every 8 (eight) hours as needed for pain.    Historical Provider, MD  ketorolac (TORADOL) 10 MG tablet Take 10 mg by mouth 4 (four) times daily.    Historical Provider, MD  Multiple Vitamins-Minerals (MULTIVITAMIN PO) Take 1 tablet by mouth daily.    Historical Provider, MD  Olmesartan-Amlodipine-HCTZ 40-10-25 MG TABS Take 1 tablet by mouth daily. Tribenzor    Historical Provider, MD  traMADol (ULTRAM) 50 MG tablet Take 50 mg by mouth every 6 (six) hours as needed. For pain  09/01/13   Historical Provider, MD   BP 167/82 mmHg  Pulse 80  Temp(Src) 98.6 F (37 C) (Oral)  Resp 16  Ht 5\' 5"  (1.651 m)  Wt 230 lb (104.327 kg)  BMI 38.27 kg/m2  SpO2 100%  LMP 03/06/2014 (Exact Date)  Vital signs normal except hypertension  Physical Exam  Constitutional: She is oriented to person, place, and time. She appears well-developed and well-nourished.  Non-toxic appearance. She does not appear ill. No distress.  HENT:  Head: Normocephalic and atraumatic.  Right Ear: External ear normal.  Left Ear: External ear normal.  Nose: Nose normal. No mucosal edema or rhinorrhea.  Mouth/Throat: Oropharynx is clear and moist and mucous membranes are normal. No dental abscesses or uvula swelling.  Eyes: Conjunctivae and EOM are normal. Pupils are equal, round, and reactive to light.  Neck: Normal range of motion and full passive range of motion without pain. Neck supple.  Cardiovascular: Normal rate, regular rhythm and normal heart sounds.  Exam reveals no gallop and no friction rub.   No murmur heard. Pulmonary/Chest: Effort normal and breath sounds normal. No respiratory distress. She has no wheezes. She has no rhonchi. She has no rales. She exhibits no tenderness and no crepitus.    Area of pain noted  Abdominal: Soft. Normal appearance and bowel sounds are normal. She exhibits no distension. There is no tenderness. There is no rebound and no guarding.  Musculoskeletal: Normal range of motion. She exhibits no edema or tenderness.  Moves all extremities well.   Neurological: She is alert and oriented to person, place, and time. She has normal strength. No cranial nerve deficit.  Skin: Skin is warm, dry and intact. No rash noted. No erythema. No pallor.  Psychiatric: She has a normal mood and affect. Her speech is normal and behavior is normal. Her mood appears not anxious.  Nursing note and vitals reviewed.   ED Course  Procedures (including critical care time) Medications   ketorolac (TORADOL) 30 MG/ML injection 30 mg (30 mg Intravenous Given 03/07/14 2305)   23:35 Pt states she has had one episode of chest pain that was sharp but only lasted less than 30 seconds a few minutes before I entered the room. I had just looked at her monitor log before entering her room and she has had a NSR with rate 78-80 with no ectopy, tachycardia.   Labs Review  Results for orders placed or performed during the hospital encounter of 03/07/14  Comprehensive metabolic panel  Result Value Ref Range   Sodium 139 137 - 147 mEq/L   Potassium 4.1 3.7 - 5.3 mEq/L   Chloride 100 96 - 112 mEq/L  CO2 23 19 - 32 mEq/L   Glucose, Bld 89 70 - 99 mg/dL   BUN 15 6 - 23 mg/dL   Creatinine, Ser 4.09 0.50 - 1.10 mg/dL   Calcium 9.7 8.4 - 81.1 mg/dL   Total Protein 8.3 6.0 - 8.3 g/dL   Albumin 4.2 3.5 - 5.2 g/dL   AST 14 0 - 37 U/L   ALT 16 0 - 35 U/L   Alkaline Phosphatase 56 39 - 117 U/L   Total Bilirubin <0.2 (L) 0.3 - 1.2 mg/dL   GFR calc non Af Amer >90 >90 mL/min   GFR calc Af Amer >90 >90 mL/min   Anion gap 16 (H) 5 - 15  CBC with Differential  Result Value Ref Range   WBC 8.7 4.0 - 10.5 K/uL   RBC 4.88 3.87 - 5.11 MIL/uL   Hemoglobin 13.0 12.0 - 15.0 g/dL   HCT 91.4 78.2 - 95.6 %   MCV 82.6 78.0 - 100.0 fL   MCH 26.6 26.0 - 34.0 pg   MCHC 32.3 30.0 - 36.0 g/dL   RDW 21.3 08.6 - 57.8 %   Platelets 393 150 - 400 K/uL   Neutrophils Relative % 44 43 - 77 %   Neutro Abs 3.8 1.7 - 7.7 K/uL   Lymphocytes Relative 46 12 - 46 %   Lymphs Abs 4.1 (H) 0.7 - 4.0 K/uL   Monocytes Relative 5 3 - 12 %   Monocytes Absolute 0.5 0.1 - 1.0 K/uL   Eosinophils Relative 4 0 - 5 %   Eosinophils Absolute 0.3 0.0 - 0.7 K/uL   Basophils Relative 1 0 - 1 %   Basophils Absolute 0.0 0.0 - 0.1 K/uL  D-dimer, quantitative  Result Value Ref Range   D-Dimer, Quant <0.27 0.00 - 0.48 ug/mL-FEU  Magnesium  Result Value Ref Range   Magnesium 2.0 1.5 - 2.5 mg/dL    Laboratory interpretation all  normal     Results for orders placed or performed during the hospital encounter of 02/01/13  CBC with Differential  Result Value Ref Range   WBC 7.3 4.0 - 10.5 K/uL   RBC 4.97 3.87 - 5.11 MIL/uL   Hemoglobin 13.5 12.0 - 15.0 g/dL   HCT 46.9 62.9 - 52.8 %   MCV 81.1 78.0 - 100.0 fL   MCH 27.2 26.0 - 34.0 pg   MCHC 33.5 30.0 - 36.0 g/dL   RDW 41.3 24.4 - 01.0 %   Platelets 382 150 - 400 K/uL   Neutrophils Relative % 45 43 - 77 %   Neutro Abs 3.2 1.7 - 7.7 K/uL   Lymphocytes Relative 47 (H) 12 - 46 %   Lymphs Abs 3.4 0.7 - 4.0 K/uL   Monocytes Relative 5 3 - 12 %   Monocytes Absolute 0.4 0.1 - 1.0 K/uL   Eosinophils Relative 3 0 - 5 %   Eosinophils Absolute 0.2 0.0 - 0.7 K/uL   Basophils Relative 1 0 - 1 %   Basophils Absolute 0.0 0.0 - 0.1 K/uL  Comprehensive metabolic panel  Result Value Ref Range   Sodium 139 135 - 145 mEq/L   Potassium 3.7 3.5 - 5.1 mEq/L   Chloride 101 96 - 112 mEq/L   CO2 25 19 - 32 mEq/L   Glucose, Bld 84 70 - 99 mg/dL   BUN 9 6 - 23 mg/dL   Creatinine, Ser 2.72 0.50 - 1.10 mg/dL   Calcium 9.7 8.4 - 53.6 mg/dL   Total  Protein 8.3 6.0 - 8.3 g/dL   Albumin 4.2 3.5 - 5.2 g/dL   AST 16 0 - 37 U/L   ALT 16 0 - 35 U/L   Alkaline Phosphatase 62 39 - 117 U/L   Total Bilirubin 0.2 (L) 0.3 - 1.2 mg/dL   GFR calc non Af Amer >90 >90 mL/min   GFR calc Af Amer >90 >90 mL/min  Troponin I  Result Value Ref Range   Troponin I <0.30 <0.30 ng/mL  D-dimer, quantitative  Result Value Ref Range   D-Dimer, Quant <0.27 0.00 - 0.48 ug/mL-FEU        Imaging Review Dg Chest 2 View  03/07/2014   CLINICAL DATA:  Chest pain 4 the last 24 hr.  High blood pressure  EXAM: CHEST  2 VIEW  COMPARISON:  02/01/2013  FINDINGS: The heart size and mediastinal contours are within normal limits. Both lungs are clear. The visualized skeletal structures are unremarkable.  IMPRESSION: No active cardiopulmonary disease.   Electronically Signed   By: Signa Kell M.D.   On:  03/07/2014 22:51     EKG Interpretation   Date/Time:  Tuesday March 07 2014 21:56:30 EST Ventricular Rate:  77 PR Interval:  166 QRS Duration: 92 QT Interval:  392 QTC Calculation: 444 R Axis:   78 Text Interpretation:  Sinus rhythm Normal ECG No significant change since  last tracing 01 Feb 2013 Confirmed by [facility]  MD-I, Mandee Pluta ([id number]) on  03/07/2014 10:32:40 PM      MDM  patient presents with atypical chest pain that is improved with nonsteroidal anti-inflammatory drugs. She denies any stress in her life right now. Patient will be discharged back to her PCP to see if further evaluation is indicated.    Final diagnoses:  Chest pain    Plan discharge  Devoria Albe, MD, Franz Dell, MD 03/07/14 [phone]

## 2014-03-07 NOTE — Discharge Instructions (Signed)
Try ice and heat on your chest to see if it helps. You can take OTC Advil 3 tabs every 6 hrs OR Aleve 2 tabs twice a day for your atypical chest pain. Please call Dr Tedra SenegalBland's office tomorrow to let her know about your ED visit and to see if she wants to do any other testing. Return to the ED if you get a fever, cough, struggle to breathe or seem worse.

## 2014-03-07 NOTE — ED Notes (Signed)
Pt arrived to the Ed with a complaint of chest pain.  Pt states's he has had similar pain in the past.  Pt states pain has been present since Thursday.  Pt states the pain is located in the left chest and then radiates to the central chest.

## 2014-04-14 DIAGNOSIS — K760 Fatty (change of) liver, not elsewhere classified: Secondary | ICD-10-CM | POA: Insufficient documentation

## 2014-05-01 ENCOUNTER — Other Ambulatory Visit (HOSPITAL_COMMUNITY)
Admission: RE | Admit: 2014-05-01 | Discharge: 2014-05-01 | Disposition: A | Discharge status: Home / Self Care | Payer: BC Managed Care – PPO | Source: Ambulatory Visit | Attending: Family Medicine | Admitting: Family Medicine

## 2014-05-01 ENCOUNTER — Other Ambulatory Visit: Payer: BC Managed Care – PPO | Admitting: Family Medicine

## 2014-05-01 DIAGNOSIS — N760 Acute vaginitis: Secondary | ICD-10-CM | POA: Diagnosis present

## 2014-05-01 DIAGNOSIS — Z113 Encounter for screening for infections with a predominantly sexual mode of transmission: Secondary | ICD-10-CM | POA: Diagnosis present

## 2014-05-02 ENCOUNTER — Other Ambulatory Visit: Payer: Self-pay | Admitting: Family Medicine

## 2014-05-02 DIAGNOSIS — R1032 Left lower quadrant pain: Secondary | ICD-10-CM

## 2014-05-04 ENCOUNTER — Ambulatory Visit
Admission: RE | Admit: 2014-05-04 | Discharge: 2014-05-04 | Disposition: A | Discharge status: Home / Self Care | Payer: BC Managed Care – PPO | Source: Ambulatory Visit | Attending: Family Medicine | Admitting: Family Medicine

## 2014-05-04 DIAGNOSIS — R1032 Left lower quadrant pain: Secondary | ICD-10-CM

## 2014-05-04 LAB — URINE CYTOLOGY ANCILLARY ONLY
BACTERIAL VAGINITIS: POSITIVE — AB
CANDIDA VAGINITIS: NEGATIVE
Chlamydia: NEGATIVE
Neisseria Gonorrhea: NEGATIVE
Trichomonas: NEGATIVE

## 2014-05-18 ENCOUNTER — Other Ambulatory Visit: Payer: Self-pay | Admitting: Obstetrics and Gynecology

## 2014-05-18 DIAGNOSIS — R319 Hematuria, unspecified: Secondary | ICD-10-CM

## 2014-05-18 DIAGNOSIS — R109 Unspecified abdominal pain: Secondary | ICD-10-CM

## 2014-05-23 ENCOUNTER — Ambulatory Visit
Admission: RE | Admit: 2014-05-23 | Discharge: 2014-05-23 | Disposition: A | Discharge status: Home / Self Care | Payer: BC Managed Care – PPO | Source: Ambulatory Visit | Attending: Obstetrics and Gynecology | Admitting: Obstetrics and Gynecology

## 2014-05-23 DIAGNOSIS — R109 Unspecified abdominal pain: Secondary | ICD-10-CM

## 2014-05-23 DIAGNOSIS — R319 Hematuria, unspecified: Secondary | ICD-10-CM

## 2014-05-23 MED ORDER — IOHEXOL 300 MG/ML  SOLN
125.0000 mL | Freq: Once | INTRAMUSCULAR | Status: AC | PRN
  Administered 2014-05-23: 125 mL via INTRAVENOUS

## 2014-09-22 ENCOUNTER — Other Ambulatory Visit: Payer: Self-pay | Admitting: Obstetrics and Gynecology

## 2014-10-09 ENCOUNTER — Other Ambulatory Visit: Payer: Self-pay | Admitting: Obstetrics and Gynecology

## 2014-10-19 NOTE — Patient Instructions (Addendum)
   Your procedure is scheduled on: Tuesday, July 19  Enter through the [facility]tr[facility] at: 6 AM Pick up the phone at the desk and dial [phone] and inform us of your arrival.  Please call this number if you have any problems the morning of surgery: [phone]  Remember: Do not eat or drink after midnight: Monday Take these medicines the morning of surgery with a SIP OF WATER: None  Do not wear jewelry, make-up, or FINGER nail polish No metal in your hair or on your body. Do not wear lotions, powders, perfumes.  You may wear deodorant.  Do not bring valuables to the hospital.  Patients discharged on the day of surgery will not be allowed to drive home.  Home with husband Jamaine cell [phone]

## 2014-10-20 ENCOUNTER — Encounter (HOSPITAL_COMMUNITY): Payer: Self-pay

## 2014-10-20 ENCOUNTER — Encounter (HOSPITAL_COMMUNITY)
Admission: RE | Admit: 2014-10-20 | Discharge: 2014-10-20 | Disposition: A | Discharge status: Home / Self Care | Payer: BC Managed Care – PPO | Source: Ambulatory Visit | Attending: Obstetrics and Gynecology | Admitting: Obstetrics and Gynecology

## 2014-10-20 DIAGNOSIS — Z01818 Encounter for other preprocedural examination: Secondary | ICD-10-CM | POA: Insufficient documentation

## 2014-10-20 LAB — CBC
HEMATOCRIT: 38.1 % (ref 36.0–46.0)
Hemoglobin: 12.7 g/dL (ref 12.0–15.0)
MCH: 27.1 pg (ref 26.0–34.0)
MCHC: 33.3 g/dL (ref 30.0–36.0)
MCV: 81.2 fL (ref 78.0–100.0)
Platelets: 369 10*3/uL (ref 150–400)
RBC: 4.69 MIL/uL (ref 3.87–5.11)
RDW: 14.1 % (ref 11.5–15.5)
WBC: 9.4 10*3/uL (ref 4.0–10.5)

## 2014-10-20 LAB — BASIC METABOLIC PANEL
Anion gap: 8 (ref 5–15)
BUN: 13 mg/dL (ref 6–20)
CALCIUM: 9.1 mg/dL (ref 8.9–10.3)
CO2: 25 mmol/L (ref 22–32)
Chloride: 102 mmol/L (ref 101–111)
Creatinine, Ser: 0.76 mg/dL (ref 0.44–1.00)
GFR calc non Af Amer: >60 mL/min (ref >60)
Glucose, Bld: 96 mg/dL (ref 65–99)
POTASSIUM: 4.3 mmol/L (ref 3.5–5.1)
Sodium: 135 mmol/L (ref 135–145)

## 2014-10-24 ENCOUNTER — Other Ambulatory Visit: Payer: Self-pay | Admitting: Obstetrics and Gynecology

## 2014-10-25 ENCOUNTER — Other Ambulatory Visit (HOSPITAL_COMMUNITY): Payer: Self-pay | Admitting: Obstetrics and Gynecology

## 2014-10-25 NOTE — H&P (Signed)
Rebecca Fry is a 29 y.o.  female P: 2-1-2-3 presents for diagnostic laparoscopy because of chronic pelvic pain. For years the patient has had a menstrual flow that lasted 7-10 days with a pad change hourly and cramping that she rated at an 8/10 on a 10 point pain scale.  She was placed on the Skyla IUD in 2014 that decreased her bleeding to only occasional spotting but the cramps continued.  Over the past year the patient developed daily, constant sharp/crampy pelvic pain that has occurred every day.  She only finds relief with Vicodin in that it makes her sleep.  She denies any aggravating factors, dyspareunia, changes in bowel movements or urinary tract symptoms.  She admits to occasional, non-radiating lower back pain. A CT scan of abdomen and pelvis was performed in February to evaluate her symptoms but the findings were normal except for fatty liver infiltrates.  She was then placed on a trial of Provera 30 mg daily however,  the patient's pain did not change.  A pelvic ultrasound in March 2016 showed:  uterus: 6.73 x 5.43 x 4.56 cm, endometrium: 0.427, IUD in proper orientation per 3D imaging;  Right ovary: 3.04 x 2.75 x 2.60 cm and a simple (decreasing in size) 2 cm cyst and left ovary: 2.44 x 1.64 x 2.17 cm.  Gonorrhea and chlamydia tests were negative. Given that her pain has not responded to medical therapy and is  debilitating  and refractory, she wishes to proceed with surgical evaluation and possible management.  Past Medical History  OB History: G:6;  P:2-1-3-3     SVB: 2007, 2010 and 2012  GYN History: menarche: 29YO    LMP: none due to IUD    Contracepton IUD-Skyla  The patient denies history of sexually transmitted disease.   Has a history of abnormal PAP smear treated with cryotherapy (2008)   Last PAP smear: 2015-normal  Medical History: Migraine, Fatty Liver on CT Scan and  Hypertension  Surgical History: 2013 Hysteroscopy D & C;  2008 Removal of Abdominal Lipoma & Cervical  Cryotherapy;   2007  Dilatation & Curettage and Tonsillectomy  Denies problems with anesthesia or history of blood transfusions  Family History: Cardiovascular Disease, Hypertension, Diabetes Mellitus, Irritable Bowel Syndrome,  Thyroid Disease, Renal Disease, Stroke  and Peptic Ulcer Disease  Social History: Married employed as a Runner, broadcasting/film/videoTeacher;    Denies alcohol or tobacco use   Medications:  Skyla IUD placed  02/24/2013 Losartan/Hctz 50/12.5 daily Allegra-D  12 hour  bid Multivitamins daily   Allergies  Allergen Reactions  . Hydrocodone Itching     Denies sensitivity to peanuts, shellfish, soy, latex or adhesives.  ROS: Admits to glasses,  swelling of hands and feet and occasional nausea, but denies headache, vision changes, nasal congestion, dysphagia, tinnitus, dizziness, hoarseness, cough,  chest pain, shortness of breath, nausea, vomiting, diarrhea,constipation,  urinary frequency, urgency  dysuria, hematuria, vaginitis symptoms, swelling of joints,easy bruising,  myalgias, arthralgias, skin rashes, unexplained weight loss and except as is mentioned in the history of present illness, patient's review of systems is otherwise negative.   Physical Exam  Bp: 118/70   P: 80    R: 20   Temperature: 99.4 degrees F orally     Weight: 244 lbs.  Height:  5'5"  BMI: 40.6  Neck: supple without masses or thyromegaly Lungs: clear to auscultation Heart: regular rate and rhythm Abdomen: soft, tender in both lower quadrants without guarding or rebound  and no organomegaly Pelvic:EGBUS- wnl;  vagina-normal rugae; uterus-normal size but tender cervix without lesions or motion tenderness; adnexae-bilateral  tenderness or masses Extremities:  no clubbing, cyanosis or edema   Assesment: Chronic Pelvic Pain   Disposition:  A discussion was held with patient regarding the indication for her procedure(s) along with the risks, which include but are not limited to: reaction to anesthesia, damage to  adjacent organs, infection and excessive bleeding. The patient verbalized understanding of these risks and has consented to proceed with a Diagnostic Laparoscopy at [facility] on October 31, 2014   CSN# [id number]   Yer Olivencia J. Lowell Guitar, PA-C  for Dr. Maris Berger. Haygood

## 2014-10-31 ENCOUNTER — Ambulatory Visit (HOSPITAL_COMMUNITY): Payer: BC Managed Care – PPO | Admitting: Anesthesiology

## 2014-10-31 ENCOUNTER — Ambulatory Visit (HOSPITAL_COMMUNITY)
Admission: RE | Admit: 2014-10-31 | Discharge: 2014-10-31 | Disposition: A | Discharge status: Home / Self Care | Payer: BC Managed Care – PPO | Source: Ambulatory Visit | Attending: Obstetrics and Gynecology | Admitting: Obstetrics and Gynecology

## 2014-10-31 ENCOUNTER — Encounter (HOSPITAL_COMMUNITY): Payer: Self-pay | Admitting: Anesthesiology

## 2014-10-31 ENCOUNTER — Encounter (HOSPITAL_COMMUNITY)
Admission: RE | Disposition: A | Discharge status: Home / Self Care | Payer: Self-pay | Source: Ambulatory Visit | Attending: Obstetrics and Gynecology

## 2014-10-31 DIAGNOSIS — R102 Pelvic and perineal pain: Secondary | ICD-10-CM | POA: Diagnosis present

## 2014-10-31 DIAGNOSIS — Z6841 Body Mass Index (BMI) 40.0 and over, adult: Secondary | ICD-10-CM | POA: Insufficient documentation

## 2014-10-31 DIAGNOSIS — Z79899 Other long term (current) drug therapy: Secondary | ICD-10-CM | POA: Diagnosis not present

## 2014-10-31 DIAGNOSIS — G8929 Other chronic pain: Secondary | ICD-10-CM | POA: Insufficient documentation

## 2014-10-31 DIAGNOSIS — N838 Other noninflammatory disorders of ovary, fallopian tube and broad ligament: Secondary | ICD-10-CM | POA: Insufficient documentation

## 2014-10-31 DIAGNOSIS — R51 Headache: Secondary | ICD-10-CM | POA: Diagnosis not present

## 2014-10-31 DIAGNOSIS — I10 Essential (primary) hypertension: Secondary | ICD-10-CM | POA: Insufficient documentation

## 2014-10-31 HISTORY — PX: LAPAROSCOPY: SHX197

## 2014-10-31 LAB — TYPE AND SCREEN
ABO/RH(D): A POS
Antibody Screen: NEGATIVE

## 2014-10-31 LAB — ABO/RH: ABO/RH(D): A POS

## 2014-10-31 SURGERY — LAPAROSCOPY OPERATIVE
Anesthesia: General | Site: Abdomen

## 2014-10-31 MED ORDER — NEOSTIGMINE METHYLSULFATE 10 MG/10ML IV SOLN
INTRAVENOUS | Status: AC
  Filled 2014-10-31: qty 1

## 2014-10-31 MED ORDER — KETOROLAC TROMETHAMINE 30 MG/ML IJ SOLN
INTRAMUSCULAR | Status: AC
  Filled 2014-10-31: qty 1

## 2014-10-31 MED ORDER — PROMETHAZINE HCL 25 MG/ML IJ SOLN
INTRAMUSCULAR | Status: AC
  Administered 2014-10-31: 6.25 mg via INTRAVENOUS
  Filled 2014-10-31: qty 1

## 2014-10-31 MED ORDER — BUPIVACAINE HCL (PF) 0.25 % IJ SOLN
INTRAMUSCULAR | Status: AC
  Filled 2014-10-31: qty 30

## 2014-10-31 MED ORDER — ONDANSETRON HCL 4 MG/2ML IJ SOLN
INTRAMUSCULAR | Status: AC
  Filled 2014-10-31: qty 2

## 2014-10-31 MED ORDER — MEPERIDINE HCL 25 MG/ML IJ SOLN: 6.2500 mg | INTRAMUSCULAR | Status: DC | PRN

## 2014-10-31 MED ORDER — HYDROCODONE-ACETAMINOPHEN 5-300 MG PO TABS: 1.0000 | ORAL_TABLET | ORAL | Status: DC | PRN

## 2014-10-31 MED ORDER — GLYCOPYRROLATE 0.2 MG/ML IJ SOLN
INTRAMUSCULAR | Status: AC
  Filled 2014-10-31: qty 3

## 2014-10-31 MED ORDER — PROPOFOL 10 MG/ML IV BOLUS
INTRAVENOUS | Status: DC | PRN
  Administered 2014-10-31: 170 mg via INTRAVENOUS

## 2014-10-31 MED ORDER — ONDANSETRON HCL 4 MG/2ML IJ SOLN
INTRAMUSCULAR | Status: DC | PRN
  Administered 2014-10-31: 4 mg via INTRAVENOUS

## 2014-10-31 MED ORDER — NEOSTIGMINE METHYLSULFATE 10 MG/10ML IV SOLN
INTRAVENOUS | Status: DC | PRN
  Administered 2014-10-31: 4 mg via INTRAVENOUS

## 2014-10-31 MED ORDER — SCOPOLAMINE 1 MG/3DAYS TD PT72
1.0000 | MEDICATED_PATCH | Freq: Once | TRANSDERMAL | Status: DC
  Administered 2014-10-31: 1.5 mg via TRANSDERMAL

## 2014-10-31 MED ORDER — HYDROCODONE-ACETAMINOPHEN 5-325 MG PO TABS
1.0000 | ORAL_TABLET | Freq: Once | ORAL | Status: AC
  Administered 2014-10-31: 1 via ORAL

## 2014-10-31 MED ORDER — FENTANYL CITRATE (PF) 100 MCG/2ML IJ SOLN
25.0000 ug | INTRAMUSCULAR | Status: DC | PRN
  Administered 2014-10-31 (×2): 50 ug via INTRAVENOUS

## 2014-10-31 MED ORDER — HYDROCODONE-ACETAMINOPHEN 5-325 MG PO TABS
ORAL_TABLET | ORAL | Status: AC
  Filled 2014-10-31: qty 1

## 2014-10-31 MED ORDER — FENTANYL CITRATE (PF) 250 MCG/5ML IJ SOLN
INTRAMUSCULAR | Status: AC
  Filled 2014-10-31: qty 5

## 2014-10-31 MED ORDER — PROMETHAZINE HCL 25 MG/ML IJ SOLN
6.2500 mg | Freq: Once | INTRAMUSCULAR | Status: AC
  Administered 2014-10-31: 6.25 mg via INTRAVENOUS

## 2014-10-31 MED ORDER — FENTANYL CITRATE (PF) 100 MCG/2ML IJ SOLN
INTRAMUSCULAR | Status: AC
  Administered 2014-10-31: 50 ug via INTRAVENOUS
  Filled 2014-10-31: qty 2

## 2014-10-31 MED ORDER — PROPOFOL 10 MG/ML IV BOLUS
INTRAVENOUS | Status: AC
  Filled 2014-10-31: qty 20

## 2014-10-31 MED ORDER — KETOROLAC TROMETHAMINE 30 MG/ML IJ SOLN
INTRAMUSCULAR | Status: DC | PRN
  Administered 2014-10-31: 30 mg via INTRAVENOUS
  Administered 2014-10-31: 30 mg via INTRAMUSCULAR

## 2014-10-31 MED ORDER — MIDAZOLAM HCL 2 MG/2ML IJ SOLN
INTRAMUSCULAR | Status: AC
  Filled 2014-10-31: qty 2

## 2014-10-31 MED ORDER — FENTANYL CITRATE (PF) 100 MCG/2ML IJ SOLN
INTRAMUSCULAR | Status: DC | PRN
  Administered 2014-10-31 (×2): 50 ug via INTRAVENOUS
  Administered 2014-10-31: 25 ug via INTRAVENOUS
  Administered 2014-10-31: 50 ug via INTRAVENOUS
  Administered 2014-10-31: 25 ug via INTRAVENOUS
  Administered 2014-10-31: 50 ug via INTRAVENOUS

## 2014-10-31 MED ORDER — METOCLOPRAMIDE HCL 5 MG/ML IJ SOLN
10.0000 mg | Freq: Once | INTRAMUSCULAR | Status: AC | PRN
  Administered 2014-10-31: 10 mg via INTRAVENOUS

## 2014-10-31 MED ORDER — LIDOCAINE HCL (CARDIAC) 20 MG/ML IV SOLN
INTRAVENOUS | Status: AC
  Filled 2014-10-31: qty 5

## 2014-10-31 MED ORDER — LIDOCAINE HCL (CARDIAC) 20 MG/ML IV SOLN
INTRAVENOUS | Status: DC | PRN
  Administered 2014-10-31: 30 mg via INTRAVENOUS

## 2014-10-31 MED ORDER — HEPARIN SODIUM (PORCINE) 5000 UNIT/ML IJ SOLN
INTRAMUSCULAR | Status: AC
  Filled 2014-10-31: qty 1

## 2014-10-31 MED ORDER — EPHEDRINE SULFATE 50 MG/ML IJ SOLN
INTRAMUSCULAR | Status: DC | PRN
  Administered 2014-10-31: 15 mg via INTRAVENOUS
  Administered 2014-10-31: 10 mg via INTRAVENOUS

## 2014-10-31 MED ORDER — DEXAMETHASONE SODIUM PHOSPHATE 4 MG/ML IJ SOLN
INTRAMUSCULAR | Status: AC
  Filled 2014-10-31: qty 1

## 2014-10-31 MED ORDER — LACTATED RINGERS IV SOLN
INTRAVENOUS | Status: DC
  Administered 2014-10-31 (×2): via INTRAVENOUS

## 2014-10-31 MED ORDER — GLYCOPYRROLATE 0.2 MG/ML IJ SOLN
INTRAMUSCULAR | Status: DC | PRN
  Administered 2014-10-31: 0.6 mg via INTRAVENOUS

## 2014-10-31 MED ORDER — ROCURONIUM BROMIDE 100 MG/10ML IV SOLN
INTRAVENOUS | Status: AC
  Filled 2014-10-31: qty 1

## 2014-10-31 MED ORDER — EPHEDRINE 5 MG/ML INJ
INTRAVENOUS | Status: AC
  Filled 2014-10-31: qty 10

## 2014-10-31 MED ORDER — ROCURONIUM BROMIDE 100 MG/10ML IV SOLN
INTRAVENOUS | Status: DC | PRN
  Administered 2014-10-31: 10 mg via INTRAVENOUS
  Administered 2014-10-31: 40 mg via INTRAVENOUS

## 2014-10-31 MED ORDER — BUPIVACAINE HCL (PF) 0.25 % IJ SOLN
INTRAMUSCULAR | Status: DC | PRN
  Administered 2014-10-31: 30 mL

## 2014-10-31 MED ORDER — MIDAZOLAM HCL 2 MG/2ML IJ SOLN
INTRAMUSCULAR | Status: DC | PRN
  Administered 2014-10-31: 2 mg via INTRAVENOUS

## 2014-10-31 MED ORDER — IBUPROFEN 600 MG PO TABS: ORAL_TABLET | ORAL | Status: DC

## 2014-10-31 MED ORDER — DEXAMETHASONE SODIUM PHOSPHATE 10 MG/ML IJ SOLN
INTRAMUSCULAR | Status: DC | PRN
  Administered 2014-10-31: 4 mg via INTRAVENOUS

## 2014-10-31 MED ORDER — METOCLOPRAMIDE HCL 5 MG/ML IJ SOLN
INTRAMUSCULAR | Status: AC
  Filled 2014-10-31: qty 2

## 2014-10-31 SURGICAL SUPPLY — 28 items
CABLE HIGH FREQUENCY MONO STRZ (ELECTRODE) ×2 IMPLANT
CHLORAPREP W/TINT 26ML (MISCELLANEOUS) ×2 IMPLANT
CLOTH BEACON ORANGE TIMEOUT ST (SAFETY) ×2 IMPLANT
CONTAINER PREFILL 10% NBF 60ML (FORM) IMPLANT
DRSG COVADERM PLUS 2X2 (GAUZE/BANDAGES/DRESSINGS) ×4 IMPLANT
DRSG OPSITE POSTOP 3X4 (GAUZE/BANDAGES/DRESSINGS) IMPLANT
GLOVE SURG SS PI 6.5 STRL IVOR (GLOVE) ×4 IMPLANT
GOWN STRL REUS W/TWL LRG LVL3 (GOWN DISPOSABLE) ×4 IMPLANT
LIQUID BAND (GAUZE/BANDAGES/DRESSINGS) ×2 IMPLANT
NS IRRIG 1000ML POUR BTL (IV SOLUTION) ×2 IMPLANT
PACK LAPAROSCOPY BASIN (CUSTOM PROCEDURE TRAY) ×2 IMPLANT
PAD POSITIONER PINK NONSTERILE (MISCELLANEOUS) ×2 IMPLANT
POUCH SPECIMEN RETRIEVAL 10MM (ENDOMECHANICALS) IMPLANT
SET IRRIG TUBING LAPAROSCOPIC (IRRIGATION / IRRIGATOR) IMPLANT
SHEARS HARMONIC ACE PLUS 36CM (ENDOMECHANICALS) IMPLANT
STRIP CLOSURE SKIN 1/4X3 (GAUZE/BANDAGES/DRESSINGS) IMPLANT
SUT MNCRL AB 4-0 PS2 18 (SUTURE) ×2 IMPLANT
SUT VIC AB 3-0 PS2 18 (SUTURE)
SUT VIC AB 3-0 PS2 18XBRD (SUTURE) IMPLANT
SUT VICRYL 0 ENDOLOOP (SUTURE) IMPLANT
SUT VICRYL 0 UR6 27IN ABS (SUTURE) ×4 IMPLANT
SYR 50ML LL SCALE MARK (SYRINGE) ×2 IMPLANT
TOWEL OR 17X24 6PK STRL BLUE (TOWEL DISPOSABLE) ×4 IMPLANT
TRAY FOLEY CATH SILVER 14FR (SET/KITS/TRAYS/PACK) ×2 IMPLANT
TROCAR BALL TOP DISP 5MM (ENDOMECHANICALS) ×2 IMPLANT
TROCAR XCEL DIL TIP R 11M (ENDOMECHANICALS) IMPLANT
WARMER LAPAROSCOPE (MISCELLANEOUS) ×2 IMPLANT
WATER STERILE IRR 1000ML POUR (IV SOLUTION) IMPLANT

## 2014-10-31 NOTE — Transfer of Care (Signed)
Immediate Anesthesia Transfer of Care Note  Patient: Rebecca Fry  Procedure(s) Performed: Procedure(s) with comments: LAPAROSCOPY OPERATIVE (N/A) - Right Tubal Biopsy  Patient Location: PACU  Anesthesia Type:General  Level of Consciousness: awake, oriented and patient cooperative  Airway & Oxygen Therapy: Patient Spontanous Breathing and Patient connected to nasal cannula oxygen  Post-op Assessment: Report given to RN and Post -op Vital signs reviewed and stable  Post vital signs: Reviewed and stable  Last Vitals:  Filed Vitals:   10/31/14 0617  BP: 158/79  Pulse: 87  Temp: 36.9 C    Complications: No apparent anesthesia complications

## 2014-10-31 NOTE — Anesthesia Postprocedure Evaluation (Signed)
Anesthesia Post Note  Patient: Rebecca Fry  Procedure(s) Performed: Procedure(s) (LRB): LAPAROSCOPY OPERATIVE (N/A)  Anesthesia type: General  Patient location: PACU  Post pain: Pain level controlled  Post assessment: Post-op Vital signs reviewed  Last Vitals:  Filed Vitals:   10/31/14 1247  BP: 155/84  Pulse: 107  Temp:   Resp: 16    Post vital signs: Reviewed  Level of consciousness: sedated  Complications: No apparent anesthesia complications

## 2014-10-31 NOTE — Discharge Instructions (Signed)
° ° ° °  DISCHARGE INSTRUCTIONS: Laparoscopy  The following instructions have been prepared to help you care for yourself upon your return home today.  Wound care:  Do not get the incision wet for the first 24 hours. The incision should be kept clean and dry.  The Band-Aids or dressings may be removed the day after surgery.  Should the incision become sore, red, and swollen after the first week, check with your doctor.  Personal hygiene:  Shower the day after your procedure.  Activity and limitations:  Do NOT drive or operate any equipment today.  Do NOT lift anything more than 15 pounds for 2-3 weeks after surgery.  Do NOT rest in bed all day.  Walking is encouraged. Walk each day, starting slowly with 5-minute walks 3 or 4 times a day. Slowly increase the length of your walks.  Walk up and down stairs slowly.  Do NOT do strenuous activities, such as golfing, playing tennis, bowling, running, biking, weight lifting, gardening, mowing, or vacuuming for 2-4 weeks. Ask your doctor when it is okay to start.  Diet: Eat a light meal as desired this evening. You may resume your usual diet tomorrow.  Return to work: This is dependent on the type of work you do. For the most part you can return to a desk job within a week of surgery. If you are more active at work, please discuss this with your doctor.  What to expect after your surgery: You may have a slight burning sensation when you urinate on the first day. You may have a very small amount of blood in the urine. Expect to have a small amount of vaginal discharge/light bleeding for 1-2 weeks. It is not unusual to have abdominal soreness and bruising for up to 2 weeks. You may be tired and need more rest for about 1 week. You may experience shoulder pain for 24-72 hours. Lying flat in bed may relieve it.  NO IBUPROFEN PRODUCTS (MOTRIN, ADVIL) OR ALEVE UNTIL 3:00PM TODAY.  Scope patch may be removed on or before: 11/02/14  Call your  doctor for any of the following:  Develop a fever of 100.4 or greater  Inability to urinate 6 hours after discharge from hospital  Severe pain not relieved by pain medications  Persistent of heavy bleeding at incision site  Redness or swelling around incision site after a week  Increasing nausea or vomiting  Patient Signature________________________________________ Nurse Signature_________________________________________

## 2014-10-31 NOTE — H&P (Signed)
History and Physical Interval Note:   10/31/2014   7:17 AM   Rebecca Fry  has presented today for surgery, with the diagnosis of Pelvic Pain  The various methods of treatment have been discussed with the patient and family. After consideration of risks, benefits and other options for treatment, the patient has consented to  Procedure(s): LAPAROSCOPY OPERATIVE as a surgical intervention .  I have examined the pt,  reviewed the patients' chart and labs. Negative pregnancy test was done in the office a week ago.  Questions were answered to the patient's satisfaction.   The patient states she is able to take Vicodin as recently as two weeks ago and does not have an allergy to it.   Hal MoralesHAYGOOD,Darrien Belter P  MD

## 2014-10-31 NOTE — Anesthesia Preprocedure Evaluation (Addendum)
Anesthesia Evaluation  Patient identified by MRN, date of birth, ID band Patient awake    Reviewed: Allergy & Precautions, NPO status , Patient's Chart, lab work & pertinent test results, reviewed documented beta blocker date and time   History of Anesthesia Complications (+) PONV and history of anesthetic complications  Airway Mallampati: III  TM Distance: >3 FB Neck ROM: Full    Dental no notable dental hx. (+) Teeth Intact   Pulmonary neg pulmonary ROS,  breath sounds clear to auscultation  Pulmonary exam normal       Cardiovascular hypertension, Pt. on medications negative cardio ROS Normal cardiovascular examRhythm:Regular Rate:Normal     Neuro/Psych  Headaches, negative psych ROS   GI/Hepatic negative GI ROS, Neg liver ROS,   Endo/Other  Morbid obesity  Renal/GU negative Renal ROS  negative genitourinary   Musculoskeletal negative musculoskeletal ROS (+)   Abdominal (+) - obese,   Peds  Hematology  (+) anemia ,   Anesthesia Other Findings   Reproductive/Obstetrics Pelvic Pain Ovarian Cyst                           Anesthesia Physical Anesthesia Plan  ASA: III  Anesthesia Plan: General   Post-op Pain Management:    Induction: Intravenous  Airway Management Planned: Oral ETT  Additional Equipment:   Intra-op Plan:   Post-operative Plan: Extubation in OR  Informed Consent: I have reviewed the patients History and Physical, chart, labs and discussed the procedure including the risks, benefits and alternatives for the proposed anesthesia with the patient or authorized representative who has indicated his/her understanding and acceptance.   Dental advisory given  Plan Discussed with: CRNA, Anesthesiologist and Surgeon  Anesthesia Plan Comments:         Anesthesia Quick Evaluation

## 2014-10-31 NOTE — Anesthesia Procedure Notes (Signed)
Procedure Name: Intubation Date/Time: 10/31/2014 7:33 AM Performed by: Suella GroveMOORE, Porschea Borys C Pre-anesthesia Checklist: Patient identified, Timeout performed, Emergency Drugs available, Suction available and Patient being monitored Patient Re-evaluated:Patient Re-evaluated prior to inductionOxygen Delivery Method: Circle system utilized and Simple face mask Preoxygenation: Pre-oxygenation with 100% oxygen Intubation Type: IV induction Ventilation: Mask ventilation without difficulty Laryngoscope Size: 3 and Mac Grade View: Grade II Tube size: 7.0 mm Number of attempts: 1 Placement Confirmation: ETT inserted through vocal cords under direct vision,  positive ETCO2,  CO2 detector and breath sounds checked- equal and bilateral Secured at: 21 (com) cm Dental Injury: Teeth and Oropharynx as per pre-operative assessment

## 2014-11-01 ENCOUNTER — Encounter (HOSPITAL_COMMUNITY): Payer: Self-pay | Admitting: Obstetrics and Gynecology

## 2014-11-01 NOTE — Op Note (Signed)
Diagnostic Laparoscopy Procedure Note  Indications: The patient is a 29 y.o. female with chronic pelvic pain  Pre-operative Diagnosis: pelvic pain  Post-operative Diagnosis: pelvic pain  Surgeon: Dierdre ForthHAYGOOD,VANESSA P   Assistants: none  Anesthesia: General endotracheal anesthesia  ASA Class: 2  Procedure Details  The patient was seen in the Holding Room. The risks, benefits, complications, treatment options, and expected outcomes were discussed with the patient. The possibilities of reaction to medication, pulmonary aspiration, perforation of viscus, bleeding, recurrent infection, the need for additional procedures, failure to diagnose a condition, and creating a complication requiring transfusion or operation were discussed with the patient. The patient concurred with the proposed plan, giving informed consent. The patient was taken to the Operating Room, identified as Rebecca Fry and the procedure verified as Diagnostic Laparoscopy. A Time Out was held and the above information confirmed.  After induction of general anesthesia, the patient was placed in modified dorsal lithotomy position where she was prepped, draped, and catheterized in the normal, sterile fashion.  The cervix was visualized and an intrauterine manipulator was placed. A 1 cm umbilical incision was then performed, and carried down to the fascia.  The facia was tagged with a suture of 0-Vicryl and held on either side of the incision.  The peritoneum was entered sharply and the [facility] canula inserted, then secured with the holding sutures.  A pneumoperitoneum was created and the laparoscope inserted. A second 5mm trocar was placed through an incision in the left suprpubic region.  The  Findings below were noted. Excisional biopsies of several of the vesicular lesions were obtained.  The remainder of the lesions were cauterized with monopolar cautery.  Following the procedure the umbilical sheath and suprapubic trocars were  removed under direct visualization as the  intra-abdominal carbon dioxide was expressed. The fascial incision was closed with the holding ties in figure of eight sutures, then subcutaneous and subcuticular sutures of 3-0 Monocry.  The suprpubic incision was closed with Dermabond.. The intrauterine manipulator was then removed.  Instrument, sponge, and needle counts were correct prior to abdominal closure and at the conclusion of the case.   Findings: The anterior cul-de-sac and round ligaments No lesions The uterus Normal sized.  No lesions The adnexa Left tube and ovary normal.  Right fallopian tube contained numerous white vesicular blebs near the distal end.  The right ovary was normal Cul-de-sac Suggestion of a peritoneal window, but no lesions or definite adhesion General:  Adhesions between bowel and pelvic side wall.  Estimated Blood Loss:  Minimal         Drains: none         Specimens: Tubal biopsies           Complications:  None; patient tolerated the procedure well.         Disposition: PACU - hemodynamically stable.         Condition: stable Pt discharged home after PACU care.

## 2015-04-04 DIAGNOSIS — N83201 Unspecified ovarian cyst, right side: Secondary | ICD-10-CM | POA: Insufficient documentation

## 2016-01-30 ENCOUNTER — Encounter (HOSPITAL_COMMUNITY): Payer: Self-pay | Admitting: Family Medicine

## 2016-01-30 ENCOUNTER — Emergency Department (HOSPITAL_COMMUNITY): Payer: BC Managed Care – PPO

## 2016-01-30 DIAGNOSIS — R0602 Shortness of breath: Secondary | ICD-10-CM | POA: Diagnosis present

## 2016-01-30 DIAGNOSIS — R0789 Other chest pain: Secondary | ICD-10-CM | POA: Insufficient documentation

## 2016-01-30 DIAGNOSIS — I10 Essential (primary) hypertension: Secondary | ICD-10-CM | POA: Diagnosis not present

## 2016-01-30 LAB — CBC
HEMATOCRIT: 39.4 % (ref 36.0–46.0)
HEMOGLOBIN: 13.1 g/dL (ref 12.0–15.0)
MCH: 27 pg (ref 26.0–34.0)
MCHC: 33.2 g/dL (ref 30.0–36.0)
MCV: 81.1 fL (ref 78.0–100.0)
Platelets: 398 10*3/uL (ref 150–400)
RBC: 4.86 MIL/uL (ref 3.87–5.11)
RDW: 13.8 % (ref 11.5–15.5)
WBC: 7 10*3/uL (ref 4.0–10.5)

## 2016-01-30 LAB — BASIC METABOLIC PANEL
ANION GAP: 9 (ref 5–15)
BUN: 13 mg/dL (ref 6–20)
CO2: 27 mmol/L (ref 22–32)
Calcium: 9.4 mg/dL (ref 8.9–10.3)
Chloride: 103 mmol/L (ref 101–111)
Creatinine, Ser: 0.61 mg/dL (ref 0.44–1.00)
Glucose, Bld: 111 mg/dL — ABNORMAL HIGH (ref 65–99)
POTASSIUM: 3.5 mmol/L (ref 3.5–5.1)
Sodium: 139 mmol/L (ref 135–145)

## 2016-01-30 NOTE — ED Triage Notes (Signed)
Patient reports she is experiencing mid sternal chest pain that radiates to her right arm. Pt was lying in the bed when pain started. Took TYLENOL 1000mg  at 17:30 and ASA x 2 tablets at 19:30 with no relief.

## 2016-01-31 ENCOUNTER — Emergency Department (HOSPITAL_COMMUNITY)
Admission: EM | Admit: 2016-01-31 | Discharge: 2016-01-31 | Disposition: A | Discharge status: Home / Self Care | Payer: BC Managed Care – PPO | Attending: Emergency Medicine | Admitting: Emergency Medicine

## 2016-01-31 DIAGNOSIS — R079 Chest pain, unspecified: Secondary | ICD-10-CM

## 2016-01-31 LAB — I-STAT TROPONIN, ED: TROPONIN I, POC: 0 ng/mL (ref 0.00–0.08)

## 2016-01-31 MED ORDER — GI COCKTAIL ~~LOC~~
30.0000 mL | Freq: Once | ORAL | Status: AC
  Administered 2016-01-31: 30 mL via ORAL
  Filled 2016-01-31: qty 30

## 2016-01-31 MED ORDER — OMEPRAZOLE 20 MG PO CPDR: 20.0000 mg | DELAYED_RELEASE_CAPSULE | Freq: Every day | ORAL | 0 refills | Status: DC

## 2016-01-31 NOTE — Discharge Instructions (Signed)
1. Medications: Omeprazole, usual home medications 2. Treatment: rest, drink plenty of fluids,  3. Follow Up: Please followup with your primary doctor in 1-2 days for discussion of your diagnoses and further evaluation after today's visit; if you do not have a primary care doctor use the resource guide provided to find one; Please return to the ER for worsening pain, pain that is associated with nausea, vomiting or other concerns.

## 2016-01-31 NOTE — ED Provider Notes (Signed)
[facility]-EMERGENCY DEPT Provider Note   CSN: [id number] Arrival date & time: 01/30/16  2225     History   Chief Complaint Chief Complaint  Patient presents with  . Chest Pain  . Shortness of Breath    HPI Rebecca Fry is a 30 y.o. female with a hx of anemia, BV presents to the Emergency Department complaining of gradual, persistent, left sided chest pain that radiates to the right side and right arm onset 8am.  Pt denies radiation to the back or left arm.  She states the pain is sharp.  Pt has taken Tylenol and ASA without relief.  Nothing makes it better or worse.  Pt reports increased stress in her life.  She reports drinking water and no caffeine usage.  Pt denies smoking, EtOH and street drugs.  Pt denies heart problems or URI symptoms.  NO change with food intake. Pt denies travel, estrogen usage, leg swelling or hx of DVT.  Patient reports feeling slightly short of breath which she describes as a tightness in her chest. She denies dyspnea on exertion or orthopnea.      The history is provided by the patient and medical records.  No language interpreter was used.    Past Medical History:  Diagnosis Date  . Abnormal Pap smear    colpo  . Amenorrhea   . Anemia   . Bladder infection   . BV (bacterial vaginosis)   . Chlamydia   . History of chicken pox   . Hypertension   . Migraine    otc med prn  . Ovarian cyst   . PONV (postoperative nausea and vomiting)    after surgery in 2009-nausea & vomiting  . SAB (spontaneous abortion)   . SVD (spontaneous vaginal delivery)    x 3  . Yeast infection     Patient Active Problem List   Diagnosis Date Noted  . Pelvic pain in female 10/31/2014  . Endometrial mass 04/05/2012  . Abnormal vaginal bleeding 03/24/2012  . Hypertension 03/15/2012  . Abnormal uterine bleeding 10/05/2011    Past Surgical History:  Procedure Laterality Date  . DILATION AND CURETTAGE OF UTERUS  2007  . HYSTEROSCOPY W/D&C  04/05/2012   Procedure:  DILATATION AND CURETTAGE /HYSTEROSCOPY;  Surgeon: [name], MD;  Location: [facility];  Service: Gynecology;  Laterality: N/A;  . LAPAROSCOPY N/A 10/31/2014   Procedure: LAPAROSCOPY OPERATIVE;  Surgeon: [name], MD;  Location: [facility];  Service: Gynecology;  Laterality: N/A;  Right Tubal Biopsy  . lipomas removal  2009   in [place]  . TONSILECTOMY, ADENOIDECTOMY, BILATERAL MYRINGOTOMY AND TUBES    . WISDOM TOOTH EXTRACTION  2015    OB History    Gravida Para Term Preterm AB Living   6 3     3 3    SAB TAB Ectopic Multiple Live Births   1 2     3        Home Medications    Prior to Admission medications   Medication Sig Start Date End Date Taking? Authorizing Provider  ibuprofen (ADVIL,MOTRIN) 600 MG tablet One tablet every 6 hours for 3 days, then one every 6 hrs as needed for pain 10/31/14  Yes [name], MD  Levonorgestrel (SKYLA) 13.5 MG IUD 1 each by Intrauterine route continuous.   Yes Historical Provider, MD  Olmesartan-Amlodipine-HCTZ (TRIBENZOR) 40-10-25 MG TABS Take 1 tablet by mouth at bedtime.   Yes Historical Provider, MD  omeprazole (PRILOSEC) 20 MG capsule Take  1 capsule (20 mg total) by mouth daily. 01/31/16   Dahlia Client Velvet Moomaw, PA-C    Family History Family History  Problem Relation Age of Onset  . Heart disease Paternal Grandfather   . Heart disease Father   . Hypertension Father   . Diabetes Father   . Irritable bowel syndrome Paternal Grandmother   . Thyroid disease Paternal Grandmother   . Hypertension Mother   . Anemia Mother   . Diabetes Maternal Aunt   . Diabetes Paternal Uncle     Social History Social History  Substance Use Topics  . Smoking status: Never Smoker  . Smokeless tobacco: Never Used  . Alcohol use No     Allergies   Hydrocodone   Review of Systems Review of Systems  Respiratory: Positive for shortness of breath.   Cardiovascular: Positive for chest pain.  All other systems reviewed and are  negative.    Physical Exam Updated Vital Signs BP 160/96 (BP Location: Left Arm)   Pulse 86   Temp 98.5 F (36.9 C) (Oral)   Resp 18   Ht 5\' 5"  (1.651 m)   Wt 104.3 kg   SpO2 100%   BMI 38.27 kg/m    Physical Exam  Constitutional: She appears well-developed and well-nourished. No distress.  Awake, alert, nontoxic appearance  HENT:  Head: Normocephalic and atraumatic.  Mouth/Throat: Oropharynx is clear and moist. No oropharyngeal exudate.  Eyes: Conjunctivae are normal. No scleral icterus.  Neck: Normal range of motion. Neck supple.  Cardiovascular: Normal rate, regular rhythm and intact distal pulses.   Pulmonary/Chest: Effort normal and breath sounds normal. No respiratory distress. She has no wheezes. She exhibits tenderness.  Equal chest expansion Clear and equal breath sounds Mild tenderness to palpation to the anterior chest.  Abdominal: Soft. Bowel sounds are normal. She exhibits no mass. There is no tenderness. There is no rebound and no guarding.  Musculoskeletal: Normal range of motion. She exhibits no edema.  Neurological: She is alert.  Speech is clear and goal oriented Moves extremities without ataxia  Skin: Skin is warm and dry. She is not diaphoretic.  Psychiatric: She has a normal mood and affect.  Nursing note and vitals reviewed.    ED Treatments / Results  Labs (all labs ordered are listed, but only abnormal results are displayed) Labs Reviewed  BASIC METABOLIC PANEL - Abnormal; Notable for the following:       Result Value   Glucose, Bld 111 (*)    All other components within normal limits  CBC  I-STAT TROPOININ, ED    ED ECG REPORT   Date: 01/31/2016  Rate: 86  Rhythm: normal sinus rhythm  QRS Axis: normal  Intervals: normal  ST/T Wave abnormalities: nonspecific T wave changes  Conduction Disutrbances:none  Narrative Interpretation: Diffuse nonspecific T-wave abnormalities, largely unchanged from EKG in October 2014  Old EKG Reviewed:  unchanged  I have personally reviewed the EKG tracing and agree with the computerized printout as noted.   Radiology Dg Chest 2 View  Result Date: 01/30/2016 CLINICAL DATA:  Acute onset of left-sided chest pain, shortness of breath, dry cough, headache and right arm pain. Initial encounter. EXAM: CHEST  2 VIEW COMPARISON:  Chest radiograph performed 03/07/2014 FINDINGS: The lungs are well-aerated and clear. There is no evidence of focal opacification, pleural effusion or pneumothorax. The heart is normal in size; the mediastinal contour is within normal limits. No acute osseous abnormalities are seen. IMPRESSION: No acute cardiopulmonary process seen. Electronically Signed  By: Roanna RaiderJeffery  Chang M.D.   On: 01/30/2016 23:28    Procedures Procedures (including critical care time)  Medications Ordered in ED Medications  gi cocktail (Maalox,Lidocaine,Donnatal) (30 mLs Oral Given 01/31/16 0245)     Initial Impression / Assessment and Plan / ED Course  I have reviewed the triage vital signs and the nursing notes.  Pertinent labs & imaging results that were available during my care of the patient were reviewed by me and considered in my medical decision making (see chart for details).  Clinical Course  Value Comment By Time  DG Chest 2 View No PNA or pneumothorax Dahlia ClientHannah Tomie Elko, PA-C 10/19 0236  WBC: 7.0 No leukocytosis. Dahlia ClientHannah Yerachmiel Spinney, PA-C 10/19 0237  Troponin i, poc: 0.00 WNL Dierdre ForthHannah Basma Buchner, PA-C 10/19 0237  BP: 160/96 VSS Dierdre ForthHannah Braelynn Lupton, PA-C 10/19 0237  EKG 12-Lead Nonspecific T wave abnormalities diffusely, largely unchanged from 02/01/2013 Dierdre ForthHannah Courtenay Creger, PA-C 10/19 [phone]    Patient with chest pain for almost 24 hours. Constant in nature. Patient has no cardiac history.  Heart score is 1-patient is low risk.  Chest pain is not likely of cardiac or pulmonary etiology d/t presentation, PERC negative, VSS, no tracheal deviation, no JVD or new murmur, RRR, breath  sounds equal bilaterally, EKG without acute abnormalities, negative troponin, and negative CXR. Pt has been advised to return to the ED if CP becomes exertional, associated with diaphoresis or nausea, radiates to left jaw/arm, worsens or becomes concerning in any way. Pt appears reliable for follow up and is agreeable to discharge.    Final Clinical Impressions(s) / ED Diagnoses   Final diagnoses:  Chest pain, unspecified type    New Prescriptions New Prescriptions   OMEPRAZOLE (PRILOSEC) 20 MG CAPSULE    Take 1 capsule (20 mg total) by mouth daily.    Dahlia ClientHannah Arella Blinder, PA-C 01/31/16 [phone]  Gilda Creasehristopher J Pollina, MD 01/31/16 [phone]

## 2016-01-31 NOTE — ED Notes (Signed)
PA at bedside.

## 2016-12-12 ENCOUNTER — Encounter (HOSPITAL_COMMUNITY): Payer: Self-pay | Admitting: *Deleted

## 2016-12-12 ENCOUNTER — Emergency Department (HOSPITAL_COMMUNITY)
Admission: EM | Admit: 2016-12-12 | Discharge: 2016-12-12 | Disposition: A | Discharge status: Home / Self Care | Payer: BC Managed Care – PPO | Attending: Emergency Medicine | Admitting: Emergency Medicine

## 2016-12-12 DIAGNOSIS — R05 Cough: Secondary | ICD-10-CM | POA: Insufficient documentation

## 2016-12-12 DIAGNOSIS — Z5321 Procedure and treatment not carried out due to patient leaving prior to being seen by health care provider: Secondary | ICD-10-CM | POA: Diagnosis not present

## 2016-12-12 DIAGNOSIS — Z77098 Contact with and (suspected) exposure to other hazardous, chiefly nonmedicinal, chemicals: Secondary | ICD-10-CM | POA: Insufficient documentation

## 2016-12-12 MED ORDER — ALBUTEROL SULFATE (2.5 MG/3ML) 0.083% IN NEBU
INHALATION_SOLUTION | RESPIRATORY_TRACT | Status: AC
  Filled 2016-12-12: qty 6

## 2016-12-12 MED ORDER — ALBUTEROL SULFATE (2.5 MG/3ML) 0.083% IN NEBU
5.0000 mg | INHALATION_SOLUTION | Freq: Once | RESPIRATORY_TRACT | Status: AC
  Administered 2016-12-12: 5 mg via RESPIRATORY_TRACT

## 2016-12-12 NOTE — ED Triage Notes (Signed)
rcvd call from Poison control with recommendation for pt to receive oxygen and bronchodilators after inhaling a mixture of [place], [place], Bleach, & Toilet cleaner, pt then went to work and was having SOB, pt told to come here for further eval

## 2016-12-12 NOTE — ED Notes (Signed)
Pt reports having to pick up her children and she has to leave at this time. Pt is educated to come back if she has any new or worsening symtpoms

## 2016-12-12 NOTE — ED Triage Notes (Signed)
Pt reports mixing chemicals together at work this am 0700 and inhaled the chemicals. Now having cough. Poison control sent pt here for possible neb tx. No wheezing noted at triage.

## 2018-01-01 ENCOUNTER — Encounter: Payer: Self-pay | Admitting: *Deleted

## 2018-01-01 NOTE — Progress Notes (Signed)
error 

## 2018-03-17 ENCOUNTER — Other Ambulatory Visit: Payer: Self-pay | Admitting: Family Medicine

## 2018-03-17 DIAGNOSIS — J329 Chronic sinusitis, unspecified: Secondary | ICD-10-CM

## 2018-03-23 ENCOUNTER — Other Ambulatory Visit: Payer: BC Managed Care – PPO

## 2018-03-29 ENCOUNTER — Other Ambulatory Visit: Payer: BC Managed Care – PPO

## 2018-06-07 ENCOUNTER — Other Ambulatory Visit: Payer: Self-pay | Admitting: Family Medicine

## 2018-06-07 DIAGNOSIS — M545 Low back pain, unspecified: Secondary | ICD-10-CM

## 2018-06-10 ENCOUNTER — Other Ambulatory Visit: Payer: Self-pay | Admitting: Family Medicine

## 2018-06-11 ENCOUNTER — Other Ambulatory Visit: Payer: Self-pay | Admitting: Family Medicine

## 2018-06-13 ENCOUNTER — Ambulatory Visit
Admission: RE | Admit: 2018-06-13 | Discharge: 2018-06-13 | Disposition: A | Discharge status: Home / Self Care | Payer: Self-pay | Source: Ambulatory Visit | Attending: Family Medicine | Admitting: Family Medicine

## 2018-06-13 DIAGNOSIS — M545 Low back pain, unspecified: Secondary | ICD-10-CM

## 2018-11-09 ENCOUNTER — Ambulatory Visit: Payer: BC Managed Care – PPO | Admitting: Dietician

## 2019-06-11 ENCOUNTER — Ambulatory Visit: Payer: BC Managed Care – PPO | Attending: Internal Medicine

## 2019-09-15 ENCOUNTER — Ambulatory Visit (INDEPENDENT_AMBULATORY_CARE_PROVIDER_SITE_OTHER): Payer: BC Managed Care – PPO

## 2019-09-15 ENCOUNTER — Ambulatory Visit
Admission: EM | Admit: 2019-09-15 | Discharge: 2019-09-15 | Disposition: A | Discharge status: Home / Self Care | Payer: BC Managed Care – PPO | Attending: Emergency Medicine | Admitting: Emergency Medicine

## 2019-09-15 DIAGNOSIS — M25571 Pain in right ankle and joints of right foot: Secondary | ICD-10-CM | POA: Diagnosis not present

## 2019-09-15 DIAGNOSIS — M7751 Other enthesopathy of right foot: Secondary | ICD-10-CM

## 2019-09-15 MED ORDER — IBUPROFEN 800 MG PO TABS: 800.0000 mg | ORAL_TABLET | Freq: Three times a day (TID) | ORAL | 0 refills | Status: DC

## 2019-09-15 NOTE — ED Provider Notes (Signed)
[facility]    CSN: [id number] Arrival date & time: 09/15/19  1721      History   Chief Complaint Chief Complaint  Patient presents with  . Ankle Pain    HPI Rebecca Fry is a 34 y.o. female with history of hypertension, migraines, obesity presenting for right ankle pain and swelling.  States it began late this morning at work.  Patient able to bear weight, though states this makes it worse.  Endorsing limping.  Patient cannot recall inciting event or trauma.  Denies numbness, deformity.  Past Medical History:  Diagnosis Date  . Abnormal Pap smear    colpo  . Amenorrhea   . Anemia   . Bladder infection   . BV (bacterial vaginosis)   . Chlamydia   . History of chicken pox   . Hypertension   . Migraine    otc med prn  . Ovarian cyst   . PONV (postoperative nausea and vomiting)    after surgery in 2009-nausea & vomiting  . SAB (spontaneous abortion)   . SVD (spontaneous vaginal delivery)    x 3  . Yeast infection     Patient Active Problem List   Diagnosis Date Noted  . Pelvic pain in female 10/31/2014  . Endometrial mass 04/05/2012  . Abnormal vaginal bleeding 03/24/2012  . Hypertension 03/15/2012  . Abnormal uterine bleeding 10/05/2011    Past Surgical History:  Procedure Laterality Date  . DILATION AND CURETTAGE OF UTERUS  2007  . HYSTEROSCOPY WITH D & C  04/05/2012   Procedure: DILATATION AND CURETTAGE /HYSTEROSCOPY;  Surgeon: [name], MD;  Location: [facility];  Service: Gynecology;  Laterality: N/A;  . LAPAROSCOPY N/A 10/31/2014   Procedure: LAPAROSCOPY OPERATIVE;  Surgeon: [name], MD;  Location: [facility];  Service: Gynecology;  Laterality: N/A;  Right Tubal Biopsy  . lipomas removal  2009   in [place]  . TONSILECTOMY, ADENOIDECTOMY, BILATERAL MYRINGOTOMY AND TUBES    . WISDOM TOOTH EXTRACTION  2015    OB History    Gravida  6   Para  3   Term      Preterm      AB  3   Living  3     SAB  1   TAB  2   Ectopic       Multiple      Live Births  3            Home Medications    Prior to Admission medications   Medication Sig Start Date End Date Taking? Authorizing Provider  ibuprofen (ADVIL) 800 MG tablet Take 1 tablet (800 mg total) by mouth 3 (three) times daily. 09/15/19   Hall-Potvin, [place], PA-C  Levonorgestrel (SKYLA) 13.5 MG IUD 1 each by Intrauterine route continuous.    [provider]  Olmesartan-Amlodipine-HCTZ (TRIBENZOR) 40-10-25 MG TABS Take 1 tablet by mouth at bedtime.    [provider]  omeprazole (PRILOSEC) 20 MG capsule Take 1 capsule (20 mg total) by mouth daily. 01/31/16   Muthersbaugh, Dahlia Client, PA-C    Family History Family History  Problem Relation Age of Onset  . Heart disease Paternal Grandfather   . Heart disease Father   . Hypertension Father   . Diabetes Father   . Irritable bowel syndrome Paternal Grandmother   . Thyroid disease Paternal Grandmother   . Hypertension Mother   . Anemia Mother   . Diabetes Maternal Aunt   . Diabetes Paternal Uncle  Social History Social History   Tobacco Use  . Smoking status: Never Smoker  . Smokeless tobacco: Never Used  Substance Use Topics  . Alcohol use: No  . Drug use: No     Allergies   Hydrocodone   Review of Systems As per HPI   Physical Exam Triage Vital Signs ED Triage Vitals  Enc Vitals Group     BP 09/15/19 1732 (!) 160/93     Pulse Rate 09/15/19 1732 92     Resp 09/15/19 1732 18     Temp 09/15/19 1732 99 F (37.2 C)     Temp src --      SpO2 09/15/19 1732 98 %     Weight --      Height --      Head Circumference --      Peak Flow --      Pain Score 09/15/19 1744 8     Pain Loc --      Pain Edu? --      Excl. in GC? --    No data found.  Updated Vital Signs BP (!) 160/93 (BP Location: Left Arm)   Pulse 92   Temp 99 F (37.2 C)   Resp 18   SpO2 98%   Visual Acuity Right Eye Distance:   Left Eye Distance:   Bilateral Distance:    Right Eye Near:    Left Eye Near:    Bilateral Near:     Physical Exam Constitutional:      General: She is not in acute distress. HENT:     Head: Normocephalic and atraumatic.  Eyes:     General: No scleral icterus.    Pupils: Pupils are equal, round, and reactive to light.  Cardiovascular:     Rate and Rhythm: Normal rate.  Pulmonary:     Effort: Pulmonary effort is normal.  Musculoskeletal:     Comments: Right ankle with swelling over anterior aspect as compared to left.  Endorsing lateral malleoli tenderness.  Gait antalgic, strength decreased as compared to left second pain  Skin:    Coloration: Skin is not jaundiced or pale.  Neurological:     Mental Status: She is alert and oriented to person, place, and time.      UC Treatments / Results  Labs (all labs ordered are listed, but only abnormal results are displayed) Labs Reviewed - No data to display  EKG   Radiology DG Ankle Complete Right  Result Date: 09/15/2019 CLINICAL DATA:  Pain and swelling.  No known injury. EXAM: RIGHT ANKLE - COMPLETE 3+ VIEW COMPARISON:  None. FINDINGS: There is no evidence of fracture, dislocation, or joint effusion. There is no evidence of arthropathy or other focal bone abnormality. Soft tissues are unremarkable. IMPRESSION: Negative. Electronically Signed   By: Charlett Nose M.D.   On: 09/15/2019 18:06    Procedures Procedures (including critical care time)  Medications Ordered in UC Medications - No data to display  Initial Impression / Assessment and Plan / UC Course  I have reviewed the triage vital signs and the nursing notes.  Pertinent labs & imaging results that were available during my care of the patient were reviewed by me and considered in my medical decision making (see chart for details).     Patient without inciting event or trauma: Low concern for fracture, though patient requesting radiography.  Right ankle x-ray done office, reviewed by me radiology: Negative for fracture,  dislocation, joint effusion.  Relayed findings  to patient verbalized understanding.  ASO brace and crutches given in office which patient tolerated well.  Provided contact information for sports medicine if needed.  Return precautions discussed, patient verbalized understanding and is agreeable to plan. Final Clinical Impressions(s) / UC Diagnoses   Final diagnoses:  Right ankle tendonitis     Discharge Instructions     Recommend RICE: rest, ice, compression, elevation as needed for pain.   Cold therapy (ice packs) can be used to help swelling both after injury and after prolonged use of areas of chronic pain/aches.  For pain: recommend 350 mg-1000 mg of Tylenol (acetaminophen) and/or 200 mg - 800 mg of Advil (ibuprofen, Motrin) every 8 hours as needed.  May alternate between the two throughout the day as they are generally safe to take together.  DO NOT exceed more than 3000 mg of Tylenol or 3200 mg of ibuprofen in a 24 hour period as this could damage your stomach, kidneys, liver, or increase your bleeding risk.  Important to follow-up with sports medicine for persistent or worsening symptoms.    ED Prescriptions    Medication Sig Dispense Auth. Provider   ibuprofen (ADVIL) 800 MG tablet Take 1 tablet (800 mg total) by mouth 3 (three) times daily. 21 tablet Hall-Potvin, [place], PA-C     PDMP not reviewed this encounter.   Hall-Potvin, [place], [place] 09/15/19 1823

## 2019-09-15 NOTE — ED Triage Notes (Signed)
Pt c/o rt ankle pain while at work today around 11:30ish. Denies injury. C/o rt ankle swelling and pain radiating up lowe leg.

## 2019-09-15 NOTE — Discharge Instructions (Addendum)
Recommend RICE: rest, ice, compression, elevation as needed for pain.   Cold therapy (ice packs) can be used to help swelling both after injury and after prolonged use of areas of chronic pain/aches.  For pain: recommend 350 mg-1000 mg of Tylenol (acetaminophen) and/or 200 mg - 800 mg of Advil (ibuprofen, Motrin) every 8 hours as needed.  May alternate between the two throughout the day as they are generally safe to take together.  DO NOT exceed more than 3000 mg of Tylenol or 3200 mg of ibuprofen in a 24 hour period as this could damage your stomach, kidneys, liver, or increase your bleeding risk.  Important to follow-up with sports medicine for persistent or worsening symptoms.

## 2020-01-13 ENCOUNTER — Other Ambulatory Visit: Payer: Self-pay

## 2020-01-13 ENCOUNTER — Ambulatory Visit (INDEPENDENT_AMBULATORY_CARE_PROVIDER_SITE_OTHER): Payer: BC Managed Care – PPO

## 2020-01-13 ENCOUNTER — Ambulatory Visit
Admission: EM | Admit: 2020-01-13 | Discharge: 2020-01-13 | Disposition: A | Discharge status: Home / Self Care | Payer: BC Managed Care – PPO | Attending: Emergency Medicine | Admitting: Emergency Medicine

## 2020-01-13 ENCOUNTER — Encounter: Payer: Self-pay | Admitting: Emergency Medicine

## 2020-01-13 DIAGNOSIS — M79674 Pain in right toe(s): Secondary | ICD-10-CM

## 2020-01-13 DIAGNOSIS — S90111A Contusion of right great toe without damage to nail, initial encounter: Secondary | ICD-10-CM | POA: Diagnosis not present

## 2020-01-13 MED ORDER — IBUPROFEN 800 MG PO TABS: 800.0000 mg | ORAL_TABLET | Freq: Three times a day (TID) | ORAL | 0 refills | Status: DC

## 2020-01-13 NOTE — ED Triage Notes (Signed)
Pt here for right great toe pain since hitting toe

## 2020-01-13 NOTE — Discharge Instructions (Signed)
Ice and elevate Use anti-inflammatories for pain/swelling. You may take up to 800 mg Ibuprofen every 8 hours with food. You may supplement Ibuprofen with Tylenol [phone] mg every 8 hours.  No fracture Follow up if not improving

## 2020-01-14 NOTE — ED Provider Notes (Signed)
[facility]    CSN: [id number] Arrival date & time: 01/13/20  1931      History   Chief Complaint Chief Complaint  Patient presents with  . Toe Pain    HPI Rebecca Fry is a 34 y.o. female presenting today for evaluation of right great toe pain.  Patient believes she stubbed her toe on a piece of furniture yesterday.  Since has had pain and swelling in her toe that goes into her foot.  She denies prior fractures.  Taking ibuprofen.  HPI  Past Medical History:  Diagnosis Date  . Abnormal Pap smear    colpo  . Amenorrhea   . Anemia   . Bladder infection   . BV (bacterial vaginosis)   . Chlamydia   . History of chicken pox   . Hypertension   . Migraine    otc med prn  . Ovarian cyst   . PONV (postoperative nausea and vomiting)    after surgery in 2009-nausea & vomiting  . SAB (spontaneous abortion)   . SVD (spontaneous vaginal delivery)    x 3  . Yeast infection     Patient Active Problem List   Diagnosis Date Noted  . Pelvic pain in female 10/31/2014  . Endometrial mass 04/05/2012  . Abnormal vaginal bleeding 03/24/2012  . Hypertension 03/15/2012  . Abnormal uterine bleeding 10/05/2011    Past Surgical History:  Procedure Laterality Date  . DILATION AND CURETTAGE OF UTERUS  2007  . HYSTEROSCOPY WITH D & C  04/05/2012   Procedure: DILATATION AND CURETTAGE /HYSTEROSCOPY;  Surgeon: [name], MD;  Location: [facility];  Service: Gynecology;  Laterality: N/A;  . LAPAROSCOPY N/A 10/31/2014   Procedure: LAPAROSCOPY OPERATIVE;  Surgeon: [name], MD;  Location: [facility];  Service: Gynecology;  Laterality: N/A;  Right Tubal Biopsy  . lipomas removal  2009   in [place]  . TONSILECTOMY, ADENOIDECTOMY, BILATERAL MYRINGOTOMY AND TUBES    . WISDOM TOOTH EXTRACTION  2015    OB History    Gravida  6   Para  3   Term      Preterm      AB  3   Living  3     SAB  1   TAB  2   Ectopic      Multiple      Live Births  3             Home Medications    Prior to Admission medications   Medication Sig Start Date End Date Taking? Authorizing Provider  ibuprofen (ADVIL) 800 MG tablet Take 1 tablet (800 mg total) by mouth 3 (three) times daily. 01/13/20   Norvell Ureste C, PA-C  Levonorgestrel (SKYLA) 13.5 MG IUD 1 each by Intrauterine route continuous.    [provider]  Olmesartan-Amlodipine-HCTZ (TRIBENZOR) 40-10-25 MG TABS Take 1 tablet by mouth at bedtime.    [provider]  omeprazole (PRILOSEC) 20 MG capsule Take 1 capsule (20 mg total) by mouth daily. 01/31/16   Muthersbaugh, Dahlia Client, PA-C    Family History Family History  Problem Relation Age of Onset  . Heart disease Paternal Grandfather   . Heart disease Father   . Hypertension Father   . Diabetes Father   . Irritable bowel syndrome Paternal Grandmother   . Thyroid disease Paternal Grandmother   . Hypertension Mother   . Anemia Mother   . Diabetes Maternal Aunt   . Diabetes Paternal Uncle  Social History Social History   Tobacco Use  . Smoking status: Never Smoker  . Smokeless tobacco: Never Used  Substance Use Topics  . Alcohol use: No  . Drug use: No     Allergies   Hydrocodone   Review of Systems Review of Systems  Constitutional: Negative for fatigue and fever.  Eyes: Negative for visual disturbance.  Respiratory: Negative for shortness of breath.   Cardiovascular: Negative for chest pain.  Gastrointestinal: Negative for abdominal pain, nausea and vomiting.  Musculoskeletal: Positive for arthralgias, gait problem and joint swelling.  Skin: Negative for color change, rash and wound.  Neurological: Negative for dizziness, weakness, light-headedness and headaches.     Physical Exam Triage Vital Signs ED Triage Vitals [01/13/20 2003]  Enc Vitals Group     BP (!) 173/113     Pulse Rate 84     Resp 18     Temp 98.2 F (36.8 C)     Temp Source Oral     SpO2 97 %     Weight      Height      Head  Circumference      Peak Flow      Pain Score 6     Pain Loc      Pain Edu?      Excl. in GC?    No data found.  Updated Vital Signs BP (!) 173/113 (BP Location: Left Arm)   Pulse 84   Temp 98.2 F (36.8 C) (Oral)   Resp 18   SpO2 97%   Visual Acuity Right Eye Distance:   Left Eye Distance:   Bilateral Distance:    Right Eye Near:   Left Eye Near:    Bilateral Near:     Physical Exam Vitals and nursing note reviewed.  Constitutional:      Appearance: She is well-developed.     Comments: No acute distress  HENT:     Head: Normocephalic and atraumatic.     Nose: Nose normal.  Eyes:     Conjunctiva/sclera: Conjunctivae normal.  Cardiovascular:     Rate and Rhythm: Normal rate.  Pulmonary:     Effort: Pulmonary effort is normal. No respiratory distress.  Abdominal:     General: There is no distension.  Musculoskeletal:        General: Normal range of motion.     Cervical back: Neck supple.     Comments: Right foot mild swelling noted to great toe and at MTP joint extending slightly into dorsum of foot, no overlying discoloration, tender to palpation in this area, metatarsals 2 through 5 largely nontender Dorsalis pedis 2+  Skin:    General: Skin is warm and dry.  Neurological:     Mental Status: She is alert and oriented to person, place, and time.      UC Treatments / Results  Labs (all labs ordered are listed, but only abnormal results are displayed) Labs Reviewed - No data to display  EKG   Radiology DG Toe Great Right  Result Date: 01/13/2020 CLINICAL DATA:  Pt states right great toe pain x2 days, possibly hit on bed frame. EXAM: RIGHT GREAT TOE COMPARISON:  None. FINDINGS: There is no evidence of fracture or dislocation. There is no evidence of arthropathy or other focal bone abnormality. Soft tissues are unremarkable. IMPRESSION: Negative radiographs of the right great toe. Electronically Signed   By: Emmaline Kluver M.D.   On: 01/13/2020 20:29     Procedures Procedures (  including critical care time)  Medications Ordered in UC Medications - No data to display  Initial Impression / Assessment and Plan / UC Course  I have reviewed the triage vital signs and the nursing notes.  Pertinent labs & imaging results that were available during my care of the patient were reviewed by me and considered in my medical decision making (see chart for details).     X-ray negative for acute bony abnormality, suspect likely contusion/sprain, recommending anti-inflammatories ice elevation and continue monitor for gradual resolution.  Discussed strict return precautions. Patient verbalized understanding and is agreeable with plan.  Final Clinical Impressions(s) / UC Diagnoses   Final diagnoses:  Contusion of right great toe without damage to nail, initial encounter     Discharge Instructions     Ice and elevate Use anti-inflammatories for pain/swelling. You may take up to 800 mg Ibuprofen every 8 hours with food. You may supplement Ibuprofen with Tylenol [phone] mg every 8 hours.  No fracture Follow up if not improving   ED Prescriptions    Medication Sig Dispense Auth. Provider   ibuprofen (ADVIL) 800 MG tablet Take 1 tablet (800 mg total) by mouth 3 (three) times daily. 21 tablet Lorinda Copland, [place] C, PA-C     PDMP not reviewed this encounter.   Lew Dawes, [place] 01/14/20 1307

## 2020-12-08 ENCOUNTER — Encounter: Payer: Self-pay | Admitting: General Practice

## 2020-12-08 ENCOUNTER — Ambulatory Visit
Admission: EM | Admit: 2020-12-08 | Discharge: 2020-12-08 | Disposition: A | Discharge status: Home / Self Care | Payer: BC Managed Care – PPO | Attending: Internal Medicine | Admitting: Internal Medicine

## 2020-12-08 ENCOUNTER — Other Ambulatory Visit: Payer: Self-pay

## 2020-12-08 DIAGNOSIS — B354 Tinea corporis: Secondary | ICD-10-CM | POA: Diagnosis not present

## 2020-12-08 DIAGNOSIS — R21 Rash and other nonspecific skin eruption: Secondary | ICD-10-CM | POA: Diagnosis not present

## 2020-12-08 MED ORDER — CLOTRIMAZOLE 1 % EX CREA: TOPICAL_CREAM | CUTANEOUS | 2 refills | Status: DC

## 2020-12-08 NOTE — ED Triage Notes (Signed)
Bilateral breast lesions, ongoing x 2 weeks, just started itching about 3 days ago, started spreading to the chest area.

## 2020-12-08 NOTE — ED Provider Notes (Signed)
[facility]    CSN: [id number] Arrival date & time: 12/08/20  4431      History   Chief Complaint No chief complaint on file.   HPI Rebecca Fry is a 35 y.o. female.   Patient presents with rash that has been present for approximately 2 weeks.  Patient states that rash started on breasts and spread to abdomen and back.  Rash started itching approximately 3 days ago but is not painful.  Denies any difficulty breathing.  Denies any changes to lotions, soaps, detergents, foods, etc.  Denies any known fevers or sick contacts.    Past Medical History:  Diagnosis Date   Abnormal Pap smear    colpo   Amenorrhea    Anemia    Bladder infection    BV (bacterial vaginosis)    Chlamydia    History of chicken pox    Hypertension    Migraine    otc med prn   Ovarian cyst    PONV (postoperative nausea and vomiting)    after surgery in 2009-nausea & vomiting   SAB (spontaneous abortion)    SVD (spontaneous vaginal delivery)    x 3   Yeast infection     Patient Active Problem List   Diagnosis Date Noted   Pelvic pain in female 10/31/2014   Endometrial mass 04/05/2012   Abnormal vaginal bleeding 03/24/2012   Hypertension 03/15/2012   Abnormal uterine bleeding 10/05/2011    Past Surgical History:  Procedure Laterality Date   DILATION AND CURETTAGE OF UTERUS  2007   HYSTEROSCOPY WITH D & C  04/05/2012   Procedure: DILATATION AND CURETTAGE /HYSTEROSCOPY;  Surgeon: [name], MD;  Location: [facility];  Service: Gynecology;  Laterality: N/A;   LAPAROSCOPY N/A 10/31/2014   Procedure: LAPAROSCOPY OPERATIVE;  Surgeon: [name], MD;  Location: [facility];  Service: Gynecology;  Laterality: N/A;  Right Tubal Biopsy   lipomas removal  2009   in [place]   TONSILECTOMY, ADENOIDECTOMY, BILATERAL MYRINGOTOMY AND TUBES     WISDOM TOOTH EXTRACTION  2015    OB History     Gravida  6   Para  3   Term      Preterm      AB  3   Living  3      SAB  1    IAB  2   Ectopic      Multiple      Live Births  3            Home Medications    Prior to Admission medications   Medication Sig Start Date End Date Taking? Authorizing Provider  clotrimazole (LOTRIMIN) 1 % cream Apply to affected area 2 times daily for 2-4 weeks until rash resolves and 2 days after rash resolves 12/08/20  Yes Lance Muss, FNP  ibuprofen (ADVIL) 800 MG tablet Take 1 tablet (800 mg total) by mouth 3 (three) times daily. 01/13/20  Yes Wieters, Hallie C, PA-C  Levonorgestrel 13.5 MG IUD 1 each by Intrauterine route continuous.   Yes [provider]  Olmesartan-amLODIPine-HCTZ 40-10-25 MG TABS Take 1 tablet by mouth at bedtime.   Yes [provider]  omeprazole (PRILOSEC) 20 MG capsule Take 1 capsule (20 mg total) by mouth daily. 01/31/16  Yes Muthersbaugh, Dahlia Client, PA-C    Family History Family History  Problem Relation Age of Onset   Heart disease Paternal Grandfather    Heart disease Father    Hypertension  Father    Diabetes Father    Irritable bowel syndrome Paternal Grandmother    Thyroid disease Paternal Grandmother    Hypertension Mother    Anemia Mother    Diabetes Maternal Aunt    Diabetes Paternal Uncle     Social History Social History   Tobacco Use   Smoking status: Never   Smokeless tobacco: Never  Substance Use Topics   Alcohol use: No   Drug use: No     Allergies   Hydrocodone   Review of Systems Review of Systems Per HPI  Physical Exam Triage Vital Signs ED Triage Vitals  Enc Vitals Group     BP 12/08/20 0846 136/83     Pulse Rate 12/08/20 0846 89     Resp 12/08/20 0846 16     Temp 12/08/20 0846 98.3 F (36.8 C)     Temp Source 12/08/20 0846 Oral     SpO2 12/08/20 0846 98 %     Weight --      Height --      Head Circumference --      Peak Flow --      Pain Score 12/08/20 0849 0     Pain Loc --      Pain Edu? --      Excl. in GC? --    No data found.  Updated Vital Signs BP 136/83 (BP  Location: Left Arm)   Pulse 89   Temp 98.3 F (36.8 C) (Oral)   Resp 16   SpO2 98%   Visual Acuity Right Eye Distance:   Left Eye Distance:   Bilateral Distance:    Right Eye Near:   Left Eye Near:    Bilateral Near:     Physical Exam Exam conducted with a chaperone present.  Constitutional:      Appearance: Normal appearance.  HENT:     Head: Normocephalic and atraumatic.  Eyes:     Extraocular Movements: Extraocular movements intact.     Conjunctiva/sclera: Conjunctivae normal.  Pulmonary:     Effort: Pulmonary effort is normal.  Skin:    General: Skin is warm and dry.     Findings: Rash present. Rash is urticarial.     Comments: Diffuse, round, erythematous lesions present throughout breasts, abdomen, and back.   Neurological:     General: No focal deficit present.     Mental Status: She is alert and oriented to person, place, and time. Mental status is at baseline.  Psychiatric:        Mood and Affect: Mood normal.        Behavior: Behavior normal.        Thought Content: Thought content normal.        Judgment: Judgment normal.     UC Treatments / Results  Labs (all labs ordered are listed, but only abnormal results are displayed) Labs Reviewed - No data to display  EKG   Radiology No results found.  Procedures Procedures (including critical care time)  Medications Ordered in UC Medications - No data to display  Initial Impression / Assessment and Plan / UC Course  I have reviewed the triage vital signs and the nursing notes.  Pertinent labs & imaging results that were available during my care of the patient were reviewed by me and considered in my medical decision making (see chart for details).     Rash is most consistent with tinea corporis.   Will treat with clotrimazole for 2 to 4 weeks.  Advised patient to follow-up if rash does not improve with this current plan.Discussed strict return precautions. Patient verbalized understanding and is  agreeable with plan.  Final Clinical Impressions(s) / UC Diagnoses   Final diagnoses:  Tinea corporis  Rash and nonspecific skin eruption     Discharge Instructions      You have a fungal infection of the skin. You have been prescribed a cream that will treat this. Please apply cream for 2-4 weeks and 2 days after rash has resolved.      ED Prescriptions     Medication Sig Dispense Auth. Provider   clotrimazole (LOTRIMIN) 1 % cream Apply to affected area 2 times daily for 2-4 weeks until rash resolves and 2 days after rash resolves 40 g Lance Muss, FNP      PDMP not reviewed this encounter.   Lance Muss, FNP 12/08/20 [phone]

## 2020-12-08 NOTE — Discharge Instructions (Addendum)
You have a fungal infection of the skin. You have been prescribed a cream that will treat this. Please apply cream for 2-4 weeks and 2 days after rash has resolved.

## 2020-12-15 ENCOUNTER — Ambulatory Visit
Admission: EM | Admit: 2020-12-15 | Discharge: 2020-12-15 | Disposition: A | Discharge status: Home / Self Care | Payer: BC Managed Care – PPO | Attending: Urgent Care | Admitting: Urgent Care

## 2020-12-15 ENCOUNTER — Encounter: Payer: Self-pay | Admitting: General Practice

## 2020-12-15 DIAGNOSIS — B354 Tinea corporis: Secondary | ICD-10-CM

## 2020-12-15 DIAGNOSIS — L299 Pruritus, unspecified: Secondary | ICD-10-CM

## 2020-12-15 DIAGNOSIS — R21 Rash and other nonspecific skin eruption: Secondary | ICD-10-CM

## 2020-12-15 MED ORDER — HYDROXYZINE HCL 25 MG PO TABS: 12.5000 mg | ORAL_TABLET | Freq: Three times a day (TID) | ORAL | 0 refills | Status: DC | PRN

## 2020-12-15 MED ORDER — FLUCONAZOLE 150 MG PO TABS: 150.0000 mg | ORAL_TABLET | ORAL | 0 refills | Status: DC

## 2020-12-15 NOTE — ED Triage Notes (Signed)
Has some itching too

## 2020-12-15 NOTE — ED Triage Notes (Signed)
Rash, here last Saturday diagnosed with yeast, on the chest, now spread to the entire body, put on steroid cream with no improvement.

## 2020-12-15 NOTE — ED Provider Notes (Signed)
[facility]   MRN: [id number] DOB: 08-01-1985  Subjective:   Rebecca Fry is a 35 y.o. female presenting for recheck on rash under the breasts and over the torso.  Patient states the rash is very itchy.  She was seen here last Saturday and has been using clotrimazole with minimal improvement.  She has not used any other medications.  Has not started any medications.  Has not had any new exposures.  Has kept her routine the same.  Denies fever, nose, sore throat, cough, blisterlike lesions.  No current facility-administered medications for this encounter.  Current Outpatient Medications:    clotrimazole (LOTRIMIN) 1 % cream, Apply to affected area 2 times daily for 2-4 weeks until rash resolves and 2 days after rash resolves, Disp: 40 g, Rfl: 2   ibuprofen (ADVIL) 800 MG tablet, Take 1 tablet (800 mg total) by mouth 3 (three) times daily., Disp: 21 tablet, Rfl: 0   Levonorgestrel 13.5 MG IUD, 1 each by Intrauterine route continuous., Disp: , Rfl:    Olmesartan-amLODIPine-HCTZ 40-10-25 MG TABS, Take 1 tablet by mouth at bedtime., Disp: , Rfl:    omeprazole (PRILOSEC) 20 MG capsule, Take 1 capsule (20 mg total) by mouth daily., Disp: 30 capsule, Rfl: 0   Allergies  Allergen Reactions   Hydrocodone Itching    Past Medical History:  Diagnosis Date   Abnormal Pap smear    colpo   Amenorrhea    Anemia    Bladder infection    BV (bacterial vaginosis)    Chlamydia    History of chicken pox    Hypertension    Migraine    otc med prn   Ovarian cyst    PONV (postoperative nausea and vomiting)    after surgery in 2009-nausea & vomiting   SAB (spontaneous abortion)    SVD (spontaneous vaginal delivery)    x 3   Yeast infection      Past Surgical History:  Procedure Laterality Date   DILATION AND CURETTAGE OF UTERUS  2007   HYSTEROSCOPY WITH D & C  04/05/2012   Procedure: DILATATION AND CURETTAGE /HYSTEROSCOPY;  Surgeon: [name], MD;  Location: [facility];   Service: Gynecology;  Laterality: N/A;   LAPAROSCOPY N/A 10/31/2014   Procedure: LAPAROSCOPY OPERATIVE;  Surgeon: [name], MD;  Location: [facility];  Service: Gynecology;  Laterality: N/A;  Right Tubal Biopsy   lipomas removal  2009   in [place]   TONSILECTOMY, ADENOIDECTOMY, BILATERAL MYRINGOTOMY AND TUBES     WISDOM TOOTH EXTRACTION  2015    Family History  Problem Relation Age of Onset   Heart disease Paternal Grandfather    Heart disease Father    Hypertension Father    Diabetes Father    Irritable bowel syndrome Paternal Grandmother    Thyroid disease Paternal Grandmother    Hypertension Mother    Anemia Mother    Diabetes Maternal Aunt    Diabetes Paternal Uncle     Social History   Tobacco Use   Smoking status: Never   Smokeless tobacco: Never  Substance Use Topics   Alcohol use: No   Drug use: No    ROS   Objective:   Vitals: BP (!) 164/77 (BP Location: Left Arm)   Pulse 95   Temp 98.1 F (36.7 C) (Oral)   Resp 16   SpO2 96%   Physical Exam Constitutional:      General: She is not in acute distress.  Appearance: Normal appearance. She is well-developed. She is not ill-appearing, toxic-appearing or diaphoretic.  HENT:     Head: Normocephalic and atraumatic.     Nose: Nose normal.     Mouth/Throat:     Mouth: Mucous membranes are moist.     Pharynx: Oropharynx is clear.  Eyes:     General: No scleral icterus.       Right eye: No discharge.        Left eye: No discharge.     Extraocular Movements: Extraocular movements intact.     Conjunctiva/sclera: Conjunctivae normal.     Pupils: Pupils are equal, round, and reactive to light.  Cardiovascular:     Rate and Rhythm: Normal rate.  Pulmonary:     Effort: Pulmonary effort is normal.  Skin:    General: Skin is warm and dry.     Findings: Rash (multiple large hyperpigmented lesions worse under the breast but mostly overlying the torso) present.  Neurological:     General: No focal deficit  present.     Mental Status: She is alert and oriented to person, place, and time.  Psychiatric:        Mood and Affect: Mood normal.        Behavior: Behavior normal.        Thought Content: Thought content normal.        Judgment: Judgment normal.    Assessment and Plan :   PDMP not reviewed this encounter.  1. Tinea corporis   2. Rash and nonspecific skin eruption   3. Itching     Suspect a diffuse tinea corporis that is not responding to topical treatment.  Will use oral antifungal and hydroxyzine for itching. Counseled patient on potential for adverse effects with medications prescribed/recommended today, ER and return-to-clinic precautions discussed, patient verbalized understanding.    Wallis Bamberg, [place] 12/15/20 [phone]

## 2020-12-27 ENCOUNTER — Ambulatory Visit
Admission: EM | Admit: 2020-12-27 | Discharge: 2020-12-27 | Disposition: A | Discharge status: Home / Self Care | Payer: BC Managed Care – PPO | Attending: Internal Medicine | Admitting: Internal Medicine

## 2020-12-27 ENCOUNTER — Other Ambulatory Visit: Payer: Self-pay

## 2020-12-27 ENCOUNTER — Ambulatory Visit (INDEPENDENT_AMBULATORY_CARE_PROVIDER_SITE_OTHER): Payer: BC Managed Care – PPO

## 2020-12-27 DIAGNOSIS — M79671 Pain in right foot: Secondary | ICD-10-CM | POA: Diagnosis not present

## 2020-12-27 NOTE — Discharge Instructions (Addendum)
Your x-ray was negative for any abnormality.  Please take ibuprofen as needed for pain and inflammation.  Also use ice application to affected area of pain.  And elevate extremity.  Follow-up with PCP or urgent care if pain persists.   You had elevated blood pressure in urgent care today.  Please follow-up with PCP for further evaluation and management of high blood pressure.

## 2020-12-27 NOTE — ED Triage Notes (Signed)
Pt c/o edema to top of right foot last Monday denies remembering any direct injury or trauma to the area. Described as constant 8/10 throbbing sharp pain.

## 2020-12-27 NOTE — ED Provider Notes (Addendum)
[facility]    CSN: [id number] Arrival date & time: 12/27/20  1828      History   Chief Complaint Chief Complaint  Patient presents with   right foot injury    HPI Rebecca Fry is a 35 y.o. female.   Patient presents with right foot pain that started approximately 4 days.  Denies any recent injury to the foot.  Patient states that she did have a small fracture in the area of the right ankle in the past.  Denies any numbness or tingling to right foot.  Cannot bear weight but is painful.  Has taken Aleve with minimal relief of pain.  Patient also has elevated blood pressure reading in urgent care today.  Patient states that she does take blood pressure medication.  Patient states that blood pressure "is not normally elevated".  Denies any headache, blurry vision, dizziness, nausea, vomiting, chest pain, shortness of breath.    Past Medical History:  Diagnosis Date   Abnormal Pap smear    colpo   Amenorrhea    Anemia    Bladder infection    BV (bacterial vaginosis)    Chlamydia    History of chicken pox    Hypertension    Migraine    otc med prn   Ovarian cyst    PONV (postoperative nausea and vomiting)    after surgery in 2009-nausea & vomiting   SAB (spontaneous abortion)    SVD (spontaneous vaginal delivery)    x 3   Yeast infection     Patient Active Problem List   Diagnosis Date Noted   Pelvic pain in female 10/31/2014   Endometrial mass 04/05/2012   Abnormal vaginal bleeding 03/24/2012   Hypertension 03/15/2012   Abnormal uterine bleeding 10/05/2011    Past Surgical History:  Procedure Laterality Date   DILATION AND CURETTAGE OF UTERUS  2007   HYSTEROSCOPY WITH D & C  04/05/2012   Procedure: DILATATION AND CURETTAGE /HYSTEROSCOPY;  Surgeon: [name], MD;  Location: [facility];  Service: Gynecology;  Laterality: N/A;   LAPAROSCOPY N/A 10/31/2014   Procedure: LAPAROSCOPY OPERATIVE;  Surgeon: [name], MD;  Location: [facility];   Service: Gynecology;  Laterality: N/A;  Right Tubal Biopsy   lipomas removal  2009   in [place]   TONSILECTOMY, ADENOIDECTOMY, BILATERAL MYRINGOTOMY AND TUBES     WISDOM TOOTH EXTRACTION  2015    OB History     Gravida  6   Para  3   Term      Preterm      AB  3   Living  3      SAB  1   IAB  2   Ectopic      Multiple      Live Births  3            Home Medications    Prior to Admission medications   Medication Sig Start Date End Date Taking? Authorizing Provider  clotrimazole (LOTRIMIN) 1 % cream Apply to affected area 2 times daily for 2-4 weeks until rash resolves and 2 days after rash resolves 12/08/20   Lance Muss, FNP  fluconazole (DIFLUCAN) 150 MG tablet Take 1 tablet (150 mg total) by mouth once a week. 12/15/20   Wallis Bamberg, PA-C  hydrOXYzine (ATARAX/VISTARIL) 25 MG tablet Take 0.5-1 tablets (12.5-25 mg total) by mouth every 8 (eight) hours as needed for itching. 12/15/20   Wallis Bamberg, PA-C  ibuprofen (ADVIL)  800 MG tablet Take 1 tablet (800 mg total) by mouth 3 (three) times daily. 01/13/20   Wieters, Hallie C, PA-C  Levonorgestrel 13.5 MG IUD 1 each by Intrauterine route continuous.    [provider]  Olmesartan-amLODIPine-HCTZ 40-10-25 MG TABS Take 1 tablet by mouth at bedtime.    [provider]  omeprazole (PRILOSEC) 20 MG capsule Take 1 capsule (20 mg total) by mouth daily. 01/31/16   Muthersbaugh, Dahlia Client, PA-C    Family History Family History  Problem Relation Age of Onset   Heart disease Paternal Grandfather    Heart disease Father    Hypertension Father    Diabetes Father    Irritable bowel syndrome Paternal Grandmother    Thyroid disease Paternal Grandmother    Hypertension Mother    Anemia Mother    Diabetes Maternal Aunt    Diabetes Paternal Uncle     Social History Social History   Tobacco Use   Smoking status: Never   Smokeless tobacco: Never  Substance Use Topics   Alcohol use: No   Drug use: No      Allergies   Hydrocodone   Review of Systems Review of Systems Per HPI  Physical Exam Triage Vital Signs ED Triage Vitals [12/27/20 1938]  Enc Vitals Group     BP (!) 168/107     Pulse Rate 86     Resp 18     Temp 98.3 F (36.8 C)     Temp Source Oral     SpO2 98 %     Weight      Height      Head Circumference      Peak Flow      Pain Score 8     Pain Loc      Pain Edu?      Excl. in [place]?    No data found.  Updated Vital Signs BP (!) 165/94   Pulse 86   Temp 98.3 F (36.8 C) (Oral)   Resp 18   SpO2 98%   Visual Acuity Right Eye Distance:   Left Eye Distance:   Bilateral Distance:    Right Eye Near:   Left Eye Near:    Bilateral Near:     Physical Exam Constitutional:      Appearance: Normal appearance.  HENT:     Head: Normocephalic and atraumatic.  Eyes:     Extraocular Movements: Extraocular movements intact.     Conjunctiva/sclera: Conjunctivae normal.  Pulmonary:     Effort: Pulmonary effort is normal.  Musculoskeletal:     Right foot: Normal range of motion and normal capillary refill. Swelling and tenderness present. No deformity, laceration or crepitus. Normal pulse.     Left foot: Normal.     Comments: Tenderness to palpation along dorsal surface of foot at approximately second through third metatarsals.  Associated mild swelling as well.  No erythema present.  Neurovascular intact.  No lacerations or abrasions noted.  Neurological:     General: No focal deficit present.     Mental Status: She is alert and oriented to person, place, and time. Mental status is at baseline.  Psychiatric:        Mood and Affect: Mood normal.        Behavior: Behavior normal.        Thought Content: Thought content normal.        Judgment: Judgment normal.     UC Treatments / Results  Labs (all labs ordered are  listed, but only abnormal results are displayed) Labs Reviewed - No data to display  EKG   Radiology DG Foot Complete Right  Result  Date: 12/27/2020 CLINICAL DATA:  Foot pain/injury EXAM: RIGHT FOOT COMPLETE - 3+ VIEW COMPARISON:  None. FINDINGS: No fracture or dislocation is seen. The joint spaces are preserved. Mild soft tissue swelling along the dorsal forefoot. IMPRESSION: Negative. Electronically Signed   By: Charline Bills M.D.   On: 12/27/2020 20:07    Procedures Procedures (including critical care time)  Medications Ordered in UC Medications - No data to display  Initial Impression / Assessment and Plan / UC Course  I have reviewed the triage vital signs and the nursing notes.  Pertinent labs & imaging results that were available during my care of the patient were reviewed by me and considered in my medical decision making (see chart for details).     Right foot x-ray was negative for any acute bony abnormality.  Differential diagnoses include right foot contusion, right foot sprain, osteoarthritis.  Will treat conservatively with ibuprofen for patient to take as needed for pain and inflammation.  Ice application.  Patient to elevate extremity.  Advised to follow-up with PCP or urgent care if pain persists.patient had elevated blood pressure reading today as well.  Suspect that high blood pressure could be related to pain.  Advised patient to follow-up with PCP for further evaluation and management of blood pressure.  No signs of hypertensive urgency.  Discussed strict return precautions. Patient verbalized understanding and is agreeable with plan.  Final Clinical Impressions(s) / UC Diagnoses   Final diagnoses:  Right foot pain     Discharge Instructions      Your x-ray was negative for any abnormality.  Please take ibuprofen as needed for pain and inflammation.  Also use ice application to affected area of pain.  And elevate extremity.  Follow-up with PCP or urgent care if pain persists.   You had elevated blood pressure in urgent care today.  Please follow-up with PCP for further evaluation and  management of high blood pressure.     ED Prescriptions   None    PDMP not reviewed this encounter.   Lance Muss, FNP 12/27/20 2021    Lance Muss, FNP 12/27/20 2023

## 2021-08-20 ENCOUNTER — Ambulatory Visit
Admission: RE | Admit: 2021-08-20 | Discharge: 2021-08-20 | Disposition: A | Discharge status: Home / Self Care | Payer: BC Managed Care – PPO | Source: Ambulatory Visit | Attending: Family Medicine | Admitting: Family Medicine

## 2021-08-20 ENCOUNTER — Other Ambulatory Visit: Payer: Self-pay | Admitting: Family Medicine

## 2021-08-20 DIAGNOSIS — M545 Low back pain, unspecified: Secondary | ICD-10-CM

## 2022-04-23 ENCOUNTER — Other Ambulatory Visit (HOSPITAL_COMMUNITY): Payer: Self-pay

## 2022-04-23 MED ORDER — SEMAGLUTIDE-WEIGHT MANAGEMENT 2.4 MG/0.75ML ~~LOC~~ SOAJ
2.4000 mg | SUBCUTANEOUS | 3 refills | Status: DC
  Filled 2022-04-23 (×2): qty 3, 28d supply, fill #0

## 2022-05-05 ENCOUNTER — Other Ambulatory Visit (HOSPITAL_COMMUNITY): Payer: Self-pay

## 2022-05-17 ENCOUNTER — Ambulatory Visit
Admission: EM | Admit: 2022-05-17 | Discharge: 2022-05-17 | Disposition: A | Discharge status: Home / Self Care | Payer: BC Managed Care – PPO | Attending: Physician Assistant | Admitting: Physician Assistant

## 2022-05-17 DIAGNOSIS — Z1152 Encounter for screening for COVID-19: Secondary | ICD-10-CM | POA: Insufficient documentation

## 2022-05-17 DIAGNOSIS — J069 Acute upper respiratory infection, unspecified: Secondary | ICD-10-CM | POA: Insufficient documentation

## 2022-05-17 MED ORDER — OSELTAMIVIR PHOSPHATE 75 MG PO CAPS: 75.0000 mg | ORAL_CAPSULE | Freq: Two times a day (BID) | ORAL | 0 refills | Status: DC

## 2022-05-17 NOTE — ED Provider Notes (Signed)
[facility]    CSN: [id number] Arrival date & time: 05/17/22  1036      History   Chief Complaint Chief Complaint  Patient presents with   URI    HPI Rebecca Fry is a 37 y.o. female.   Patient here today for evaluation of nasal congestion, cough, and chills that started yesterday. She did have mild scratchy throat initially. She denies any nausea, vomiting or diarrhea. She did have low grade fever with Tmax of 100.3 F. She has been taking mucinex with mild relief.   The history is provided by the patient.  URI Presenting symptoms: congestion, cough, fever and sore throat   Presenting symptoms: no ear pain   Associated symptoms: no wheezing     Past Medical History:  Diagnosis Date   Abnormal Pap smear    colpo   Amenorrhea    Anemia    Bladder infection    BV (bacterial vaginosis)    Chlamydia    History of chicken pox    Hypertension    Migraine    otc med prn   Ovarian cyst    PONV (postoperative nausea and vomiting)    after surgery in 2009-nausea & vomiting   SAB (spontaneous abortion)    SVD (spontaneous vaginal delivery)    x 3   Yeast infection     Patient Active Problem List   Diagnosis Date Noted   Pelvic pain in female 10/31/2014   Endometrial mass 04/05/2012   Abnormal vaginal bleeding 03/24/2012   Hypertension 03/15/2012   Abnormal uterine bleeding 10/05/2011    Past Surgical History:  Procedure Laterality Date   DILATION AND CURETTAGE OF UTERUS  2007   HYSTEROSCOPY WITH D & C  04/05/2012   Procedure: DILATATION AND CURETTAGE /HYSTEROSCOPY;  Surgeon: Eldred Manges, MD;  Location: [facility];  Service: Gynecology;  Laterality: N/A;   LAPAROSCOPY N/A 10/31/2014   Procedure: LAPAROSCOPY OPERATIVE;  Surgeon: Eldred Manges, MD;  Location: [facility];  Service: Gynecology;  Laterality: N/A;  Right Tubal Biopsy   lipomas removal  2009   in [place], ADENOIDECTOMY, BILATERAL MYRINGOTOMY AND TUBES     WISDOM TOOTH  EXTRACTION  2015    OB History     Gravida  6   Para  3   Term      Preterm      AB  3   Living  3      SAB  1   IAB  2   Ectopic      Multiple      Live Births  3            Home Medications    Prior to Admission medications   Medication Sig Start Date End Date Taking? Authorizing Provider  oseltamivir (TAMIFLU) 75 MG capsule Take 1 capsule (75 mg total) by mouth every 12 (twelve) hours. 05/17/22  Yes Francene Finders, PA-C  clotrimazole (LOTRIMIN) 1 % cream Apply to affected area 2 times daily for 2-4 weeks until rash resolves and 2 days after rash resolves 12/08/20   Oswaldo Conroy E, FNP  hydrOXYzine (ATARAX/VISTARIL) 25 MG tablet Take 0.5-1 tablets (12.5-25 mg total) by mouth every 8 (eight) hours as needed for itching. 12/15/20   Jaynee Eagles, PA-C  ibuprofen (ADVIL) 800 MG tablet Take 1 tablet (800 mg total) by mouth 3 (three) times daily. 01/13/20   Wieters, Hallie C, PA-C  Levonorgestrel 13.5 MG IUD 1 each by  Intrauterine route continuous.    [provider]  Olmesartan-amLODIPine-HCTZ 40-10-25 MG TABS Take 1 tablet by mouth at bedtime.    [provider]  omeprazole (PRILOSEC) 20 MG capsule Take 1 capsule (20 mg total) by mouth daily. 01/31/16   Muthersbaugh, Jarrett Soho, PA-C  Semaglutide-Weight Management 2.4 MG/0.75ML SOAJ Inject 2.4 mg into the skin once a week into abdomen 04/23/22       Family History Family History  Problem Relation Age of Onset   Heart disease Paternal Grandfather    Heart disease Father    Hypertension Father    Diabetes Father    Irritable bowel syndrome Paternal Grandmother    Thyroid disease Paternal Grandmother    Hypertension Mother    Anemia Mother    Diabetes Maternal Aunt    Diabetes Paternal Uncle     Social History Social History   Tobacco Use   Smoking status: Never   Smokeless tobacco: Never  Substance Use Topics   Alcohol use: No   Drug use: No     Allergies   Hydrocodone   Review of  Systems Review of Systems  Constitutional:  Positive for chills and fever.  HENT:  Positive for congestion and sore throat. Negative for ear pain.   Eyes:  Negative for discharge and redness.  Respiratory:  Positive for cough. Negative for shortness of breath and wheezing.   Gastrointestinal:  Negative for diarrhea, nausea and vomiting.     Physical Exam Triage Vital Signs ED Triage Vitals  Enc Vitals Group     BP 05/17/22 1120 (!) 159/89     Pulse Rate 05/17/22 1119 89     Resp 05/17/22 1119 17     Temp 05/17/22 1119 98 F (36.7 C)     Temp Source 05/17/22 1119 Oral     SpO2 05/17/22 1119 97 %     Weight --      Height --      Head Circumference --      Peak Flow --      Pain Score 05/17/22 1118 2     Pain Loc --      Pain Edu? --      Excl. in [place]? --    No data found.  Updated Vital Signs BP (!) 159/89   Pulse 89   Temp 98 F (36.7 C) (Oral)   Resp 17   SpO2 97%      Physical Exam Vitals and nursing note reviewed.  Constitutional:      General: She is not in acute distress.    Appearance: Normal appearance. She is not ill-appearing.  HENT:     Head: Normocephalic and atraumatic.     Nose: Congestion present.     Mouth/Throat:     Mouth: Mucous membranes are moist.     Pharynx: No oropharyngeal exudate or posterior oropharyngeal erythema.  Eyes:     Conjunctiva/sclera: Conjunctivae normal.  Cardiovascular:     Rate and Rhythm: Normal rate and regular rhythm.     Heart sounds: Normal heart sounds. No murmur heard. Pulmonary:     Effort: Pulmonary effort is normal. No respiratory distress.     Breath sounds: Normal breath sounds. No wheezing, rhonchi or rales.  Skin:    General: Skin is warm and dry.  Neurological:     Mental Status: She is alert.  Psychiatric:        Mood and Affect: Mood normal.        Thought Content:  Thought content normal.      UC Treatments / Results  Labs (all labs ordered are listed, but only abnormal results are  displayed) Labs Reviewed  SARS CORONAVIRUS 2 (TAT 6-24 HRS)    EKG   Radiology No results found.  Procedures Procedures (including critical care time)  Medications Ordered in UC Medications - No data to display  Initial Impression / Assessment and Plan / UC Course  I have reviewed the triage vital signs and the nursing notes.  Pertinent labs & imaging results that were available during my care of the patient were reviewed by me and considered in my medical decision making (see chart for details).    Symptoms concerning for viral URI. Cannot rule out flu as we do not have testing supplies for same. Offered tamiflu and will order covid screening. Recommend symptomatic treatment otherwise and follow up with any persistent or worsening symptoms.    Final Clinical Impressions(s) / UC Diagnoses   Final diagnoses:  Acute upper respiratory infection  Encounter for screening for COVID-19   Discharge Instructions   None    ED Prescriptions     Medication Sig Dispense Auth. Provider   oseltamivir (TAMIFLU) 75 MG capsule Take 1 capsule (75 mg total) by mouth every 12 (twelve) hours. 10 capsule Francene Finders, PA-C      PDMP not reviewed this encounter.   Francene Finders, PA-C 05/17/22 1217

## 2022-05-17 NOTE — ED Triage Notes (Signed)
Pt presents with productive cough, chills and scratchy throat since yesterday.

## 2022-05-18 LAB — SARS CORONAVIRUS 2 (TAT 6-24 HRS): SARS Coronavirus 2: POSITIVE — AB

## 2022-07-01 IMAGING — CR DG THORACIC SPINE 3V
3 series · 3 of 3 positions shown · non-contrast
Comparison: None Available.

CLINICAL DATA: Low back pain

EXAM:
THORACIC SPINE - 3 VIEWS

[t thoracic spine ap]
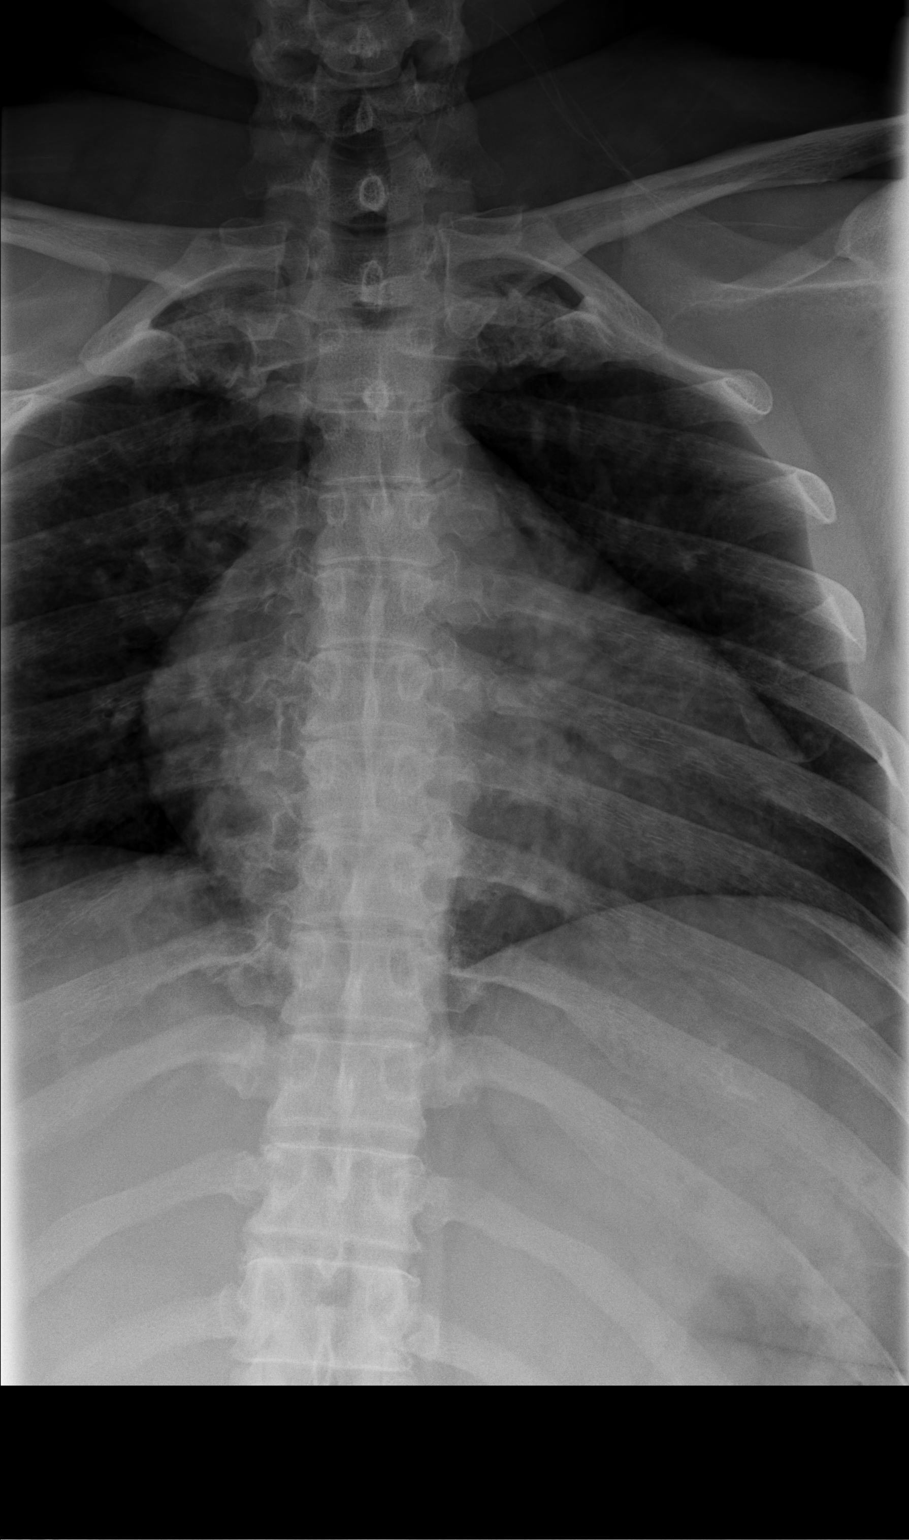

[t thoracic spine lat]
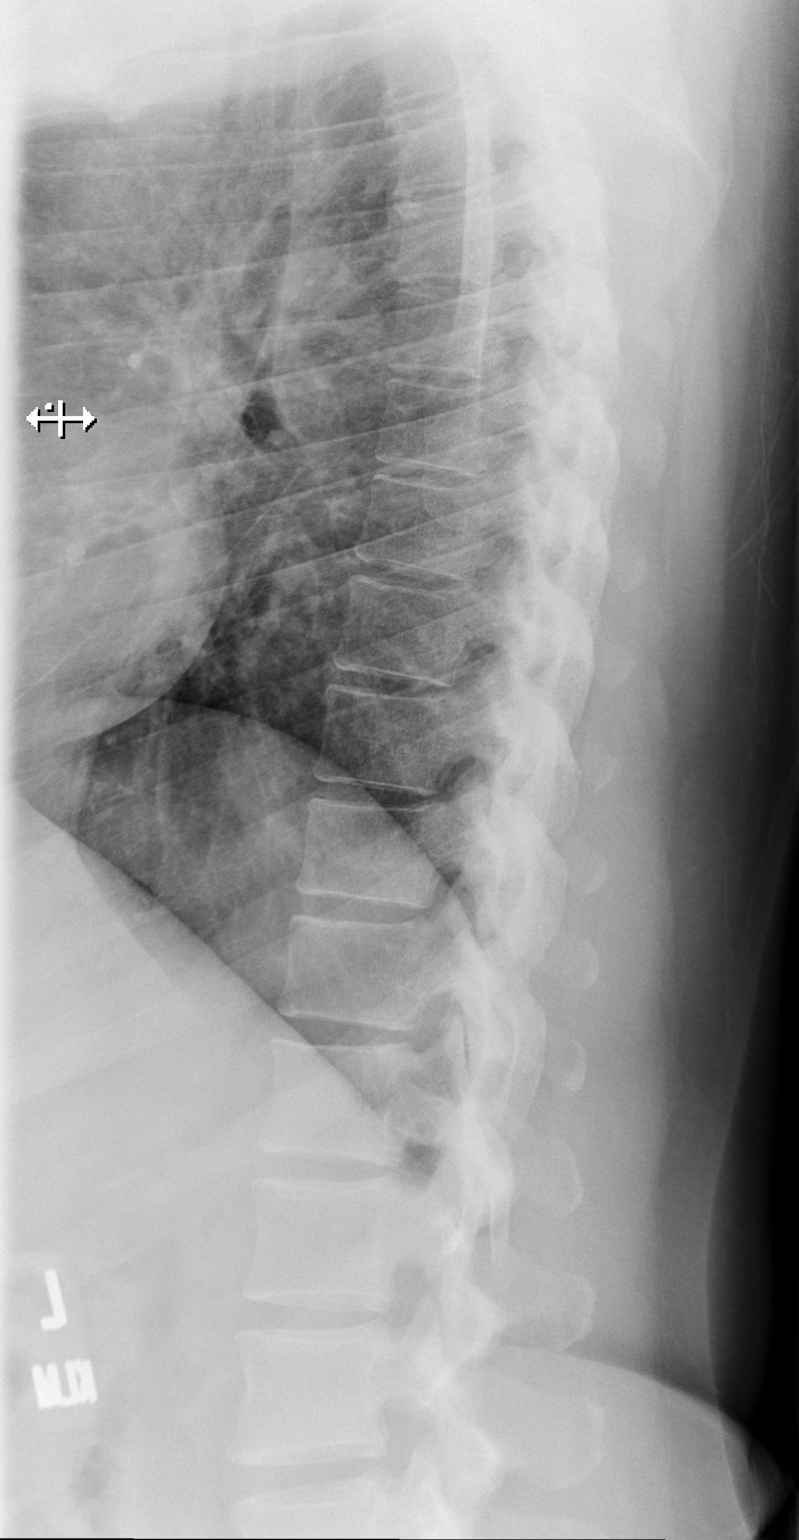

[t thoracic swimmers]
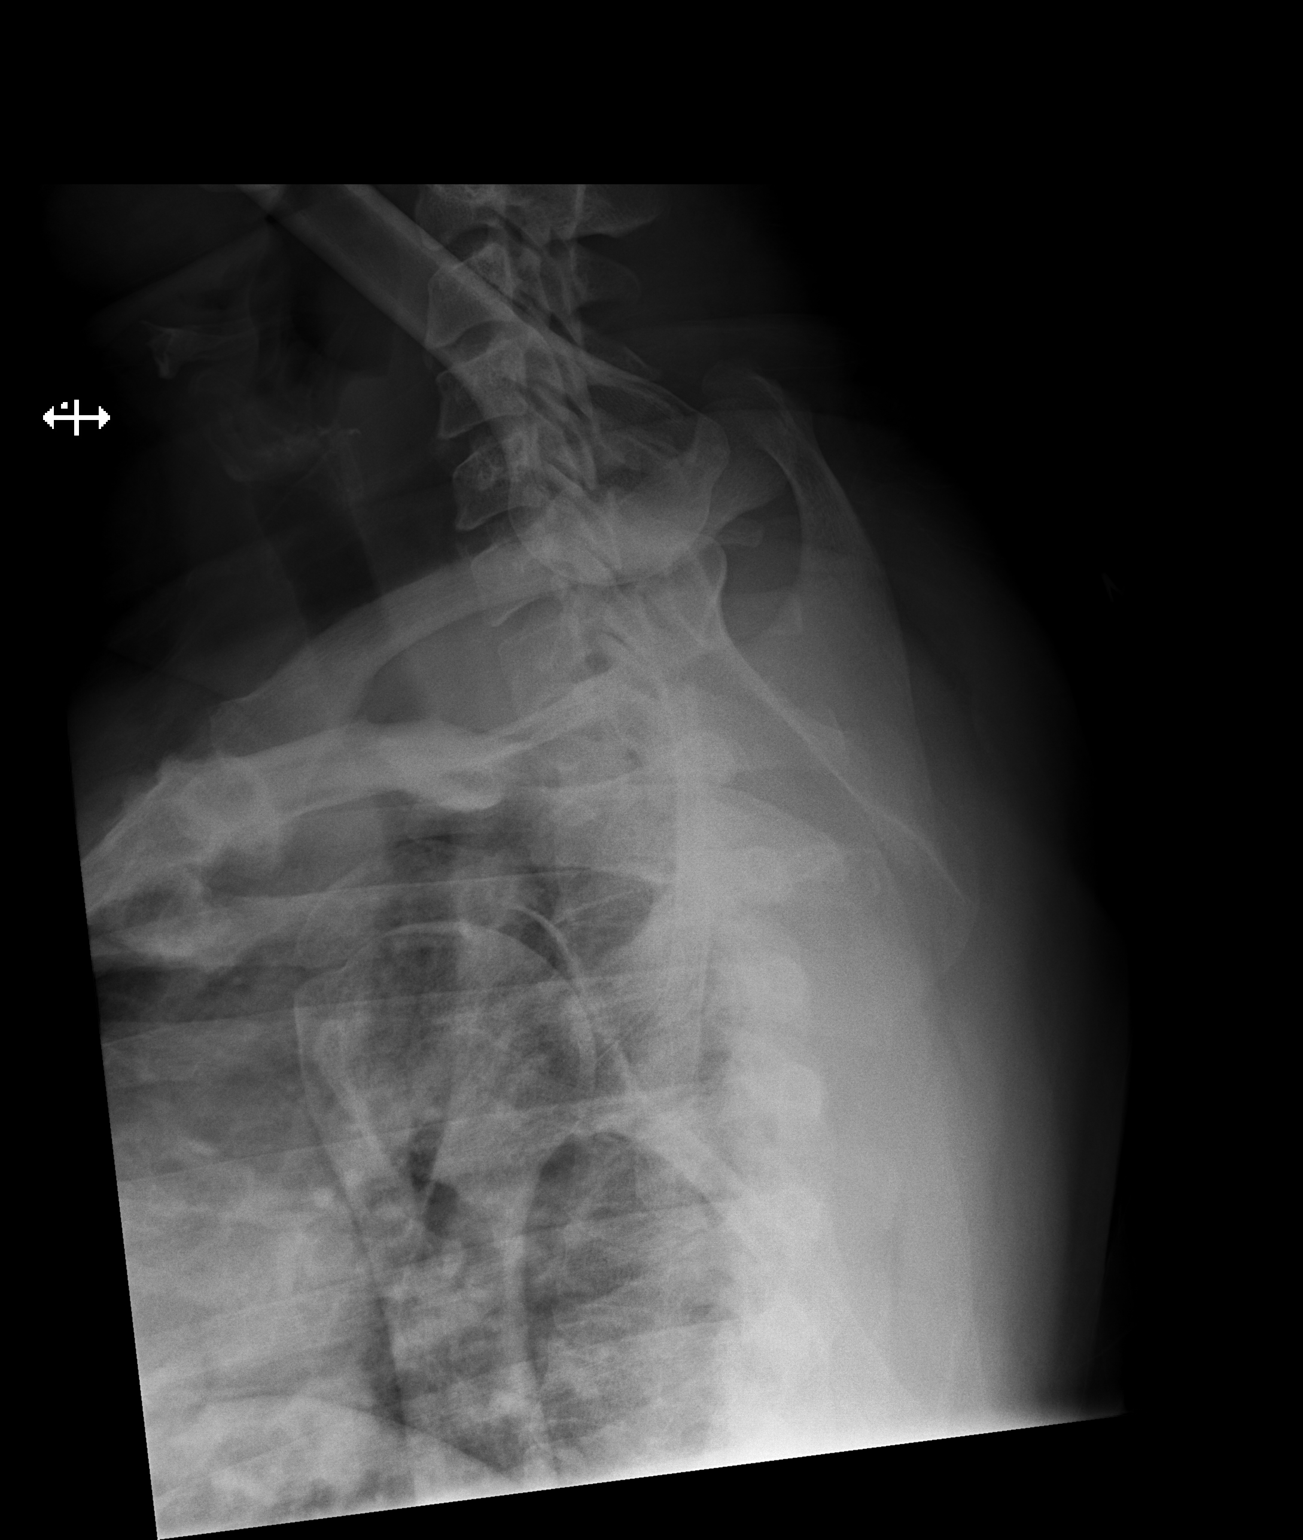

[3 of 3 positions shown; findings below may reference images not displayed]

FINDINGS: There is no evidence of thoracic spine fracture. Alignment is
normal. No other significant bone abnormalities are identified.
IMPRESSION: Negative.

## 2022-07-16 ENCOUNTER — Ambulatory Visit
Admission: EM | Admit: 2022-07-16 | Discharge: 2022-07-16 | Disposition: A | Discharge status: Home / Self Care | Payer: BC Managed Care – PPO | Attending: Emergency Medicine | Admitting: Emergency Medicine

## 2022-07-16 DIAGNOSIS — M542 Cervicalgia: Secondary | ICD-10-CM

## 2022-07-16 DIAGNOSIS — M545 Low back pain, unspecified: Secondary | ICD-10-CM

## 2022-07-16 DIAGNOSIS — R519 Headache, unspecified: Secondary | ICD-10-CM | POA: Diagnosis not present

## 2022-07-16 MED ORDER — PREDNISONE 20 MG PO TABS: 40.0000 mg | ORAL_TABLET | Freq: Every day | ORAL | 0 refills | Status: DC

## 2022-07-16 MED ORDER — CYCLOBENZAPRINE HCL 10 MG PO TABS: 10.0000 mg | ORAL_TABLET | Freq: Two times a day (BID) | ORAL | 0 refills | Status: DC | PRN

## 2022-07-16 NOTE — Discharge Instructions (Signed)
Your evaluated for neck and back pain - Symptoms are most likely muscular, low suspicion for pulm embarrassment, should improve with time - Begin prednisone every morning with food for 5 days to reduce inflammation that occurs with injury, may use Tylenol 500 to 1000 mg every 6 hours in addition to the -Headache is most likely a result of neck tension and should improve with time - May use muscle relaxant twice daily as needed for additional comfort, be mindful this may make you feel drowsy - May use ice or heat over the affected areas in 10 to 15-minute intervals - May begin massage and stretching daily to further help loosen muscles - May place pillows behind back and neck for additional support and comfort If your symptoms continue to persist past 2 weeks please follow-up with orthopedics for reevaluation, information is listed on front page
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
## 2022-07-30 ENCOUNTER — Other Ambulatory Visit (HOSPITAL_COMMUNITY): Payer: Self-pay

## 2022-07-30 MED ORDER — SEMAGLUTIDE-WEIGHT MANAGEMENT 2.4 MG/0.75ML ~~LOC~~ SOAJ
2.4000 mg | SUBCUTANEOUS | 3 refills | Status: DC
  Filled 2022-07-30 – 2022-07-31 (×2): qty 3, 28d supply, fill #0

## 2022-07-31 ENCOUNTER — Other Ambulatory Visit (HOSPITAL_COMMUNITY): Payer: Self-pay

## 2022-08-07 ENCOUNTER — Other Ambulatory Visit: Payer: Self-pay | Admitting: Family Medicine

## 2022-08-07 ENCOUNTER — Ambulatory Visit
Admission: RE | Admit: 2022-08-07 | Discharge: 2022-08-07 | Disposition: A | Discharge status: Home / Self Care | Payer: BC Managed Care – PPO | Source: Ambulatory Visit | Attending: Family Medicine | Admitting: Family Medicine

## 2022-08-07 DIAGNOSIS — R079 Chest pain, unspecified: Secondary | ICD-10-CM

## 2022-08-07 DIAGNOSIS — R0602 Shortness of breath: Secondary | ICD-10-CM

## 2022-09-10 ENCOUNTER — Encounter: Payer: Self-pay | Admitting: Urgent Care

## 2022-09-10 ENCOUNTER — Ambulatory Visit
Admission: EM | Admit: 2022-09-10 | Discharge: 2022-09-10 | Disposition: A | Discharge status: Home / Self Care | Payer: BC Managed Care – PPO

## 2022-09-10 DIAGNOSIS — M79661 Pain in right lower leg: Secondary | ICD-10-CM | POA: Diagnosis not present

## 2022-09-10 DIAGNOSIS — M7989 Other specified soft tissue disorders: Secondary | ICD-10-CM | POA: Diagnosis not present

## 2022-09-10 DIAGNOSIS — I8391 Asymptomatic varicose veins of right lower extremity: Secondary | ICD-10-CM

## 2022-09-10 MED ORDER — ACETAMINOPHEN 325 MG PO TABS: 650.0000 mg | ORAL_TABLET | Freq: Four times a day (QID) | ORAL | 0 refills | Status: DC | PRN

## 2022-09-10 NOTE — Discharge Instructions (Addendum)
Please call first thing tomorrow at [phone]. This is important to schedule your ultrasound as soon as possible to rule out a dvt. Wear compression socks to help with symptomatic varicose veins.

## 2022-09-10 NOTE — ED Provider Notes (Signed)
[facility]  Note:  This document was prepared using Conservation officer, historic buildings and may include unintentional dictation errors.  MRN: [id number] DOB: June 26, 1985  Subjective:   Rebecca Fry is a 37 y.o. female presenting for acute onset of pain behind the right knee with swelling.  Has a history of varicose veins but have not caused her issues like this before.  Works make sure she does not have a blood clot in the right lower leg.  Denies history of DVT.  No current facility-administered medications for this encounter.  Current Outpatient Medications:    amLODipine (NORVASC) 2.5 MG tablet, , Disp: , Rfl:    allopurinol (ZYLOPRIM) 100 MG tablet, Take 100 mg by mouth daily., Disp: , Rfl:    clotrimazole (LOTRIMIN) 1 % cream, Apply to affected area 2 times daily for 2-4 weeks until rash resolves and 2 days after rash resolves, Disp: 40 g, Rfl: 2   cyclobenzaprine (FLEXERIL) 10 MG tablet, Take 1 tablet (10 mg total) by mouth 2 (two) times daily as needed for muscle spasms., Disp: 20 tablet, Rfl: 0   hydrOXYzine (ATARAX/VISTARIL) 25 MG tablet, Take 0.5-1 tablets (12.5-25 mg total) by mouth every 8 (eight) hours as needed for itching., Disp: 30 tablet, Rfl: 0   ibuprofen (ADVIL) 800 MG tablet, Take 1 tablet (800 mg total) by mouth 3 (three) times daily., Disp: 21 tablet, Rfl: 0   Levonorgestrel 13.5 MG IUD, 1 each by Intrauterine route continuous., Disp: , Rfl:    Olmesartan-amLODIPine-HCTZ 40-10-25 MG TABS, Take 1 tablet by mouth at bedtime., Disp: , Rfl:    omeprazole (PRILOSEC) 20 MG capsule, Take 1 capsule (20 mg total) by mouth daily., Disp: 30 capsule, Rfl: 0   oseltamivir (TAMIFLU) 75 MG capsule, Take 1 capsule (75 mg total) by mouth every 12 (twelve) hours., Disp: 10 capsule, Rfl: 0   predniSONE (DELTASONE) 20 MG tablet, Take 2 tablets (40 mg total) by mouth daily., Disp: 10 tablet, Rfl: 0   Semaglutide-Weight Management 2.4 MG/0.75ML SOAJ, Inject 2.4 mg  into the skin once a week into abdomen, Disp: 3 mL, Rfl: 3   Semaglutide-Weight Management 2.4 MG/0.75ML SOAJ, Inject 2.4 mg into the skin (abdomen) once a week., Disp: 3 mL, Rfl: 3   Allergies  Allergen Reactions   Hydrocodone Itching    Past Medical History:  Diagnosis Date   Abnormal Pap smear    colpo   Amenorrhea    Anemia    Bladder infection    BV (bacterial vaginosis)    Chlamydia    History of chicken pox    Hypertension    Migraine    otc med prn   Ovarian cyst    PONV (postoperative nausea and vomiting)    after surgery in 2009-nausea & vomiting   SAB (spontaneous abortion)    SVD (spontaneous vaginal delivery)    x 3   Yeast infection      Past Surgical History:  Procedure Laterality Date   DILATION AND CURETTAGE OF UTERUS  2007   HYSTEROSCOPY WITH D & C  04/05/2012   Procedure: DILATATION AND CURETTAGE /HYSTEROSCOPY;  Surgeon: [name], MD;  Location: [facility];  Service: Gynecology;  Laterality: N/A;   LAPAROSCOPY N/A 10/31/2014   Procedure: LAPAROSCOPY OPERATIVE;  Surgeon: [name], MD;  Location: [facility];  Service: Gynecology;  Laterality: N/A;  Right Tubal Biopsy   lipomas removal  2009   in [place]   TONSILECTOMY, ADENOIDECTOMY, BILATERAL  MYRINGOTOMY AND TUBES     WISDOM TOOTH EXTRACTION  2015    Family History  Problem Relation Age of Onset   Heart disease Paternal Grandfather    Heart disease Father    Hypertension Father    Diabetes Father    Irritable bowel syndrome Paternal Grandmother    Thyroid disease Paternal Grandmother    Hypertension Mother    Anemia Mother    Diabetes Maternal Aunt    Diabetes Paternal Uncle     Social History   Tobacco Use   Smoking status: Never   Smokeless tobacco: Never  Vaping Use   Vaping Use: Never used  Substance Use Topics   Alcohol use: No   Drug use: No    ROS   Objective:   Vitals: BP (!) 151/88 (BP Location: Right Arm)   Pulse 76   Temp 98.9 F (37.2 C) (Oral)   Resp 18    SpO2 97%   Physical Exam Constitutional:      General: She is not in acute distress.    Appearance: Normal appearance. She is well-developed. She is not ill-appearing, toxic-appearing or diaphoretic.  HENT:     Head: Normocephalic and atraumatic.     Nose: Nose normal.     Mouth/Throat:     Mouth: Mucous membranes are moist.  Eyes:     General: No scleral icterus.       Right eye: No discharge.        Left eye: No discharge.     Extraocular Movements: Extraocular movements intact.  Cardiovascular:     Rate and Rhythm: Normal rate.  Pulmonary:     Effort: Pulmonary effort is normal.  Musculoskeletal:       Legs:  Skin:    General: Skin is warm and dry.  Neurological:     General: No focal deficit present.     Mental Status: She is alert and oriented to person, place, and time.  Psychiatric:        Mood and Affect: Mood normal.        Behavior: Behavior normal.     Assessment and Plan :   PDMP not reviewed this encounter.  1. Varicose veins of right lower extremity, unspecified whether complicated   2. Pain and swelling of right lower leg    Suspect symptomatic varicose vein, superficial thrombophlebitis.  Unfortunately at this stage near closing, I am not able to pursue an ultrasound to rule out a DVT.  Advised that if patient wants to have this workup done now, she can present to the [facility] emergency room.  Patient declines.  I placed an order to have an ultrasound to rule out DVT which she can try to schedule tomorrow.  In the meantime, we will use Tylenol, elevation, compression stockings. Counseled patient on potential for adverse effects with medications prescribed/recommended today, ER and return-to-clinic precautions discussed, patient verbalized understanding.    Wallis Bamberg, [place] 09/10/22 1946

## 2022-09-10 NOTE — ED Triage Notes (Signed)
Pt reports on and off pain behind right knee started today. Pt reports theres not an specific trigger for the pain.  Pt think she may have a blood clot.

## 2022-09-12 ENCOUNTER — Other Ambulatory Visit (HOSPITAL_COMMUNITY): Payer: Self-pay | Admitting: Urgent Care

## 2022-09-12 ENCOUNTER — Ambulatory Visit (HOSPITAL_COMMUNITY)
Admission: RE | Admit: 2022-09-12 | Discharge: 2022-09-12 | Disposition: A | Discharge status: Home / Self Care | Payer: BC Managed Care – PPO | Source: Ambulatory Visit | Attending: Urgent Care | Admitting: Urgent Care

## 2022-09-12 DIAGNOSIS — M79661 Pain in right lower leg: Secondary | ICD-10-CM | POA: Diagnosis present

## 2022-09-29 ENCOUNTER — Other Ambulatory Visit: Payer: Self-pay | Admitting: *Deleted

## 2022-09-29 DIAGNOSIS — M79661 Pain in right lower leg: Secondary | ICD-10-CM

## 2022-10-09 ENCOUNTER — Ambulatory Visit (HOSPITAL_COMMUNITY)
Admission: RE | Admit: 2022-10-09 | Discharge: 2022-10-09 | Disposition: A | Discharge status: Home / Self Care | Payer: BC Managed Care – PPO | Source: Ambulatory Visit | Attending: Vascular Surgery | Admitting: Vascular Surgery

## 2022-10-09 DIAGNOSIS — M79661 Pain in right lower leg: Secondary | ICD-10-CM | POA: Insufficient documentation

## 2022-10-24 ENCOUNTER — Encounter: Payer: BC Managed Care – PPO | Admitting: Vascular Surgery

## 2022-11-11 ENCOUNTER — Ambulatory Visit: Payer: BC Managed Care – PPO | Admitting: Physician Assistant

## 2022-11-11 ENCOUNTER — Encounter: Payer: Self-pay | Admitting: Physician Assistant

## 2022-11-11 VITALS — BP 115/60 | HR 81 | Temp 98.0°F | Resp 18 | Ht 65.0 in | Wt 255.8 lb

## 2022-11-11 DIAGNOSIS — M79661 Pain in right lower leg: Secondary | ICD-10-CM

## 2022-11-11 DIAGNOSIS — M7989 Other specified soft tissue disorders: Secondary | ICD-10-CM

## 2022-11-11 DIAGNOSIS — I872 Venous insufficiency (chronic) (peripheral): Secondary | ICD-10-CM | POA: Diagnosis not present

## 2022-11-11 NOTE — Progress Notes (Unsigned)
Requested by:  Renaye Rakers, MD [address] [place],  [place] [address]  Reason for consultation: varicose veins    History of Present Illness   Rebecca Fry is a 37 y.o. ([birth year]) female who presents for evaluation of varicose veins. She explains that she has had some visible varicose veins in her right leg for about 14 years since her second child was born. She says she has never had any issues with her veins however about 2 months ago she was at work and stood up and had a very sharp severe pain in the calf/ behind knee area of her right leg. After several hours it subsided a little but had her very worried that she had a blood clot. A nurse friend looked at her leg and recommended she go and have it evaluated. She ended up going to ER and duplex showed no DVT. She explains that at that time the varicose vein behind her knee was a little more firm, red and warm. This has since subside but she does continue to get the intermittent sharp pains. The pains occur on sitting or ambulating. She has tried elevation, compression, Ice and motrin. She has not felt that these have helped. She says otherwise her legs do not bother her. She denies any symptoms at all in left leg. She does say occasionally her right leg will feel achy at the end of the day and since May she has had some more noticeable swelling in the right leg. She denies any heaviness, tiredness, throbbing, burning, itching, bleeding or ulceration. She does have family history of DVT in her grandmother but she says this was after she was hospitalized later in life and was very sick. No other family history. No family history of venous disease.   Venous symptoms include:aching, swelling, stabbing pains Onset/duration:  > 2 months  Occupation: Interior and spatial designer of literacy Aggravating factors: sitting, standing Alleviating factors: elevation Compression:  OTC stockings and knee brace Helps:  Not really Pain medications:  Motrin Previous  vein procedures:  no History of DVT:  no  Past Medical History:  Diagnosis Date   Abnormal Pap smear    colpo   Amenorrhea    Anemia    Bladder infection    BV (bacterial vaginosis)    Chlamydia    History of chicken pox    Hypertension    Migraine    otc med prn   Ovarian cyst    PONV (postoperative nausea and vomiting)    after surgery in 2009-nausea & vomiting   SAB (spontaneous abortion)    SVD (spontaneous vaginal delivery)    x 3   Yeast infection     Past Surgical History:  Procedure Laterality Date   DILATION AND CURETTAGE OF UTERUS  2007   HYSTEROSCOPY WITH D & C  04/05/2012   Procedure: DILATATION AND CURETTAGE /HYSTEROSCOPY;  Surgeon: [name], MD;  Location: [facility];  Service: Gynecology;  Laterality: N/A;   LAPAROSCOPY N/A 10/31/2014   Procedure: LAPAROSCOPY OPERATIVE;  Surgeon: [name], MD;  Location: [facility];  Service: Gynecology;  Laterality: N/A;  Right Tubal Biopsy   lipomas removal  2009   in [place]   TONSILECTOMY, ADENOIDECTOMY, BILATERAL MYRINGOTOMY AND TUBES     WISDOM TOOTH EXTRACTION  2015    Social History   Socioeconomic History   Marital status: Divorced    Spouse name: Not on file   Number of children: Not on file  Years of education: Not on file   Highest education level: Not on file  Occupational History   Not on file  Tobacco Use   Smoking status: Never   Smokeless tobacco: Never  Vaping Use   Vaping status: Never Used  Substance and Sexual Activity   Alcohol use: No   Drug use: No   Sexual activity: Yes    Partners: Male    Birth control/protection: None, I.U.D.    Comment: skyla  Other Topics Concern   Not on file  Social History Narrative   Not on file   Social Determinants of Health   Financial Resource Strain: Not on file  Food Insecurity: Not on file  Transportation Needs: Not on file  Physical Activity: Not on file  Stress: Not on file  Social Connections: Not on file  Intimate Partner  Violence: Not on file    Family History  Problem Relation Age of Onset   Heart disease Paternal Grandfather    Heart disease Father    Hypertension Father    Diabetes Father    Irritable bowel syndrome Paternal Grandmother    Thyroid disease Paternal Grandmother    Hypertension Mother    Anemia Mother    Diabetes Maternal Aunt    Diabetes Paternal Uncle     Current Outpatient Medications  Medication Sig Dispense Refill   acetaminophen (TYLENOL) 325 MG tablet Take 2 tablets (650 mg total) by mouth every 6 (six) hours as needed for moderate pain. 30 tablet 0   allopurinol (ZYLOPRIM) 100 MG tablet Take 100 mg by mouth daily.     amLODipine (NORVASC) 2.5 MG tablet      clotrimazole (LOTRIMIN) 1 % cream Apply to affected area 2 times daily for 2-4 weeks until rash resolves and 2 days after rash resolves 40 g 2   cyclobenzaprine (FLEXERIL) 10 MG tablet Take 1 tablet (10 mg total) by mouth 2 (two) times daily as needed for muscle spasms. 20 tablet 0   hydrOXYzine (ATARAX/VISTARIL) 25 MG tablet Take 0.5-1 tablets (12.5-25 mg total) by mouth every 8 (eight) hours as needed for itching. 30 tablet 0   ibuprofen (ADVIL) 800 MG tablet Take 1 tablet (800 mg total) by mouth 3 (three) times daily. 21 tablet 0   Levonorgestrel 13.5 MG IUD 1 each by Intrauterine route continuous.     Olmesartan-amLODIPine-HCTZ 40-10-25 MG TABS Take 1 tablet by mouth at bedtime.     omeprazole (PRILOSEC) 20 MG capsule Take 1 capsule (20 mg total) by mouth daily. 30 capsule 0   oseltamivir (TAMIFLU) 75 MG capsule Take 1 capsule (75 mg total) by mouth every 12 (twelve) hours. 10 capsule 0   predniSONE (DELTASONE) 20 MG tablet Take 2 tablets (40 mg total) by mouth daily. 10 tablet 0   Semaglutide-Weight Management 2.4 MG/0.75ML SOAJ Inject 2.4 mg into the skin once a week into abdomen 3 mL 3   Semaglutide-Weight Management 2.4 MG/0.75ML SOAJ Inject 2.4 mg into the skin (abdomen) once a week. 3 mL 3   No current  facility-administered medications for this visit.    Allergies  Allergen Reactions   Hydrocodone Itching    REVIEW OF SYSTEMS (negative unless checked):   Cardiac:  []  Chest pain or chest pressure? []  Shortness of breath upon activity? []  Shortness of breath when lying flat? []  Irregular heart rhythm?  Vascular:  []  Pain in calf, thigh, or hip brought on by walking? []  Pain in feet at night that wakes you up from  your sleep? []  Blood clot in your veins? [x]  Leg swelling?  Pulmonary:  []  Oxygen at home? []  Productive cough? []  Wheezing?  Neurologic:  []  Sudden weakness in arms or legs? []  Sudden numbness in arms or legs? []  Sudden onset of difficult speaking or slurred speech? []  Temporary loss of vision in one eye? []  Problems with dizziness?  Gastrointestinal:  []  Blood in stool? []  Vomited blood?  Genitourinary:  []  Burning when urinating? []  Blood in urine?  Psychiatric:  []  Major depression  Hematologic:  []  Bleeding problems? []  Problems with blood clotting?  Dermatologic:  []  Rashes or ulcers?  Constitutional:  []  Fever or chills?  Ear/Nose/Throat:  []  Change in hearing? []  Nose bleeds? []  Sore throat?  Musculoskeletal:  []  Back pain? []  Joint pain? []  Muscle pain?   Physical Examination     Vitals:   11/11/22 1340  BP: 115/60  Pulse: 81  Resp: 18  Temp: 98 F (36.7 C)  TempSrc: Temporal  SpO2: 92%  Weight: 255 lb 12.8 oz (116 kg)  Height: 5\' 5"  (1.651 m)   Body mass index is 42.57 kg/m.  General:  WDWN in NAD; vital signs documented above Gait: Normal HENT: WNL, normocephalic Pulmonary: normal non-labored breathing  Cardiac: regular HR Abdomen: soft Vascular Exam/Pulses: 2+ Dp and PT pulses bilaterally Extremities: with varicose veins of right  posterior and medial calf and popliteal fossa, without reticular veins, without edema, without stasis pigmentation, without lipodermatosclerosis, without ulcers Musculoskeletal:  no muscle wasting or atrophy  Neurologic: A&O X 3;  No focal weakness or paresthesias are detected Psychiatric:  The pt has Normal affect.  Non-invasive Vascular Imaging   BLE Venous Insufficiency Duplex (10/09/22):  RLE:  No DVT and SVT GSV reflux SFJ to knee GSV diameter 0.6-1.15 No SSV reflux  CFV deep venous reflux  Medical Decision Making   CAROLJEAN PAZMINO is a 37 y.o. female who presents with: RLE chronic venous insufficiency with varicose veins with pain. Her recent duplex shows no DVT or SVT, she has superficial reflux in her GSV. No SSV reflux. She also does have deep reflux in CFV. Her GSV is of adequate size to be considered for venous ablation. However, I do not think her symptoms can be fully explained by her venous insufficiency. I discussed ablation as possible option for her but explained that it may or may not relieve her symptoms. I have recommended conservative therapy and I will have her return in 2 months for further evaluation. Based on the patient's history and examination, I recommend: elevation daily 20-30 minutes, thigh high compression, exercise, weight reduction, refraining from prolonged sitting or standing. I discussed with the patient the use of her 20-30 mm thigh high compression stockings and need for 3 month trial of such. The patient will follow up in 3 months with one of the vascular surgeons for further evaluation for possible lazer ablation   Graceann Congress, PA-C [facility] Office: [phone]  11/11/2022, 3:22 PM  Clinic MD: Steve Rattler

## 2022-11-12 ENCOUNTER — Encounter: Payer: Self-pay | Admitting: Physician Assistant

## 2022-12-16 ENCOUNTER — Encounter (HOSPITAL_BASED_OUTPATIENT_CLINIC_OR_DEPARTMENT_OTHER): Payer: Self-pay | Admitting: Cardiovascular Disease

## 2022-12-16 ENCOUNTER — Ambulatory Visit (HOSPITAL_BASED_OUTPATIENT_CLINIC_OR_DEPARTMENT_OTHER): Payer: BC Managed Care – PPO | Admitting: Cardiovascular Disease

## 2022-12-16 VITALS — BP 156/88 | HR 74 | Ht 65.0 in | Wt 250.0 lb

## 2022-12-16 DIAGNOSIS — I10 Essential (primary) hypertension: Secondary | ICD-10-CM

## 2022-12-16 DIAGNOSIS — R079 Chest pain, unspecified: Secondary | ICD-10-CM

## 2022-12-16 HISTORY — DX: Chest pain, unspecified: R07.9

## 2022-12-16 MED ORDER — SPIRONOLACTONE 25 MG PO TABS: 25.0000 mg | ORAL_TABLET | Freq: Every day | ORAL | 3 refills | Status: DC

## 2022-12-16 MED ORDER — METOPROLOL TARTRATE 100 MG PO TABS: ORAL_TABLET | ORAL | 0 refills | Status: DC

## 2022-12-16 NOTE — Patient Instructions (Addendum)
Medication Instructions:  START SPIRONOLACTONE 25 MG DAILY   TAKE METOPROLOL 2 HOURS PRIOR TO CT    Labwork: BMET IN 1 WEEK    Testing/Procedures: Your physician has requested that you have cardiac CT. Cardiac computed tomography (CT) is a painless test that uses an x-ray machine to take clear, detailed pictures of your heart. For further information please visit [email or web address] Please follow instruction sheet as given. THE OFFICE WILL CALL YOU ONCE INSURANCE HAS BEEN REVIEWED    Follow-Up: 2 MONTHS WITH PHARM D IN [facility]  4 MONTHS WITH DR El Brazil IN [facility] CLINIC   Special Instructions:  MONITOR YOUR BLOOD PRESSURE TWICE A DAY WITH MACHINE PROVIDED MAKE SURE YOU ARE LOGGED INTO YOUR APP     Your cardiac CT will be scheduled at one of the below locations:   [facility] [address] [place], [place] [address] [phone]  OR  [facility] [address] [place], [place] [address] [phone]  OR   [facility] [address] [place], [place] [address] [phone]  If scheduled at [facility], please arrive at the [facility] En[facility]ance (Entrance C2) of [facility] 30 minutes prior to test start time. You can use the FREE valet parking offered at entrance C (encouraged to control the heart rate for the test)  Proceed to the [facility] Radiology Department (first floor) to check-in and test prep.  All radiology patients and guests should use entrance C2 at [facility], accessed from [facility], even though the hospital's physical address listed is [address].    If scheduled at [facility] or [facility], please arrive 15 mins early for check-in and test prep.  There is spacious parking and easy access to the radiology department from the [facility]  entrance. Please enter here and check-in with the desk attendant.   Please follow these instructions carefully (unless otherwise directed):  An IV will be required for this test and Nitroglycerin will be given.  Hold all erectile dysfunction medications at least 3 days (72 hrs) prior to test. (Ie viagra, cialis, sildenafil, tadalafil, etc)   On the Night Before the Test: Be sure to Drink plenty of water. Do not consume any caffeinated/decaffeinated beverages or chocolate 12 hours prior to your test. Do not take any antihistamines 12 hours prior to your test.  On the Day of the Test: Drink plenty of water until 1 hour prior to the test. Do not eat any food 1 hour prior to test. You may take your regular medications prior to the test.  Take metoprolol (Lopressor) two hours prior to test. If you take Furosemide/Hydrochlorothiazide/Spironolactone, please HOLD on the morning of the test. FEMALES- please wear underwire-free bra if available, avoid dresses & tight clothing      After the Test: Drink plenty of water. After receiving IV contrast, you may experience a mild flushed feeling. This is normal. On occasion, you may experience a mild rash up to 24 hours after the test. This is not dangerous. If this occurs, you can take Benadryl 25 mg and increase your fluid intake. If you experience trouble breathing, this can be serious. If it is severe call 911 IMMEDIATELY. If it is mild, please call our office. If you take any of these medications: Glipizide/Metformin, Avandament, Glucavance, please do not take 48 hours after completing test unless otherwise  instructed.  We will call to schedule your test 2-4 weeks out understanding that some insurance companies will need an authorization prior to the service being performed.   For more information and frequently asked questions, please visit our website : [email or web address]  For non-scheduling related questions, please contact the  cardiac imaging nurse navigator should you have any questions/concerns: Cardiac Imaging Nurse Navigators Direct Office Dial: [phone]   For scheduling needs, including cancellations and rescheduling, please call [place], [phone].  DASH Eating Plan DASH stands for "Dietary Approaches to Stop Hypertension." The DASH eating plan is a healthy eating plan that has been shown to reduce high blood pressure (hypertension). It may also reduce your risk for type 2 diabetes, heart disease, and stroke. The DASH eating plan may also help with weight loss. What are tips for following this plan?  General guidelines Avoid eating more than 2,300 mg (milligrams) of salt (sodium) a day. If you have hypertension, you may need to reduce your sodium intake to 1,500 mg a day. Limit alcohol intake to no more than 1 drink a day for nonpregnant women and 2 drinks a day for men. One drink equals 12 oz of beer, 5 oz of wine, or 1 oz of hard liquor. Work with your health care provider to maintain a healthy body weight or to lose weight. Ask what an ideal weight is for you. Get at least 30 minutes of exercise that causes your heart to beat faster (aerobic exercise) most days of the week. Activities may include walking, swimming, or biking. Work with your health care provider or diet and nutrition specialist (dietitian) to adjust your eating plan to your individual calorie needs. Reading food labels  Check food labels for the amount of sodium per serving. Choose foods with less than 5 percent of the Daily Value of sodium. Generally, foods with less than 300 mg of sodium per serving fit into this eating plan. To find whole grains, look for the word "whole" as the first word in the ingredient list. Shopping Buy products labeled as "low-sodium" or "no salt added." Buy fresh foods. Avoid canned foods and premade or frozen meals. Cooking Avoid adding salt when cooking. Use salt-free seasonings or herbs instead of  table salt or sea salt. Check with your health care provider or pharmacist before using salt substitutes. Do not fry foods. Cook foods using healthy methods such as baking, boiling, grilling, and broiling instead. Cook with heart-healthy oils, such as olive, canola, soybean, or sunflower oil. Meal planning Eat a balanced diet that includes: 5 or more servings of fruits and vegetables each day. At each meal, try to fill half of your plate with fruits and vegetables. Up to 6-8 servings of whole grains each day. Less than 6 oz of lean meat, poultry, or fish each day. A 3-oz serving of meat is about the same size as a deck of cards. One egg equals 1 oz. 2 servings of low-fat dairy each day. A serving of nuts, seeds, or beans 5 times each week. Heart-healthy fats. Healthy fats called Omega-3 fatty acids are found in foods such as flaxseeds and coldwater fish, like sardines, salmon, and mackerel. Limit how much you eat of the following: Canned or prepackaged foods. Food that is high in trans fat, such as fried foods. Food that is high in saturated fat, such as fatty meat. Sweets, desserts, sugary drinks, and other foods with added sugar. Full-fat dairy products. Do not salt foods before eating. Try to eat at least  2 vegetarian meals each week. Eat more home-cooked food and less restaurant, buffet, and fast food. When eating at a restaurant, ask that your food be prepared with less salt or no salt, if possible. What foods are recommended? The items listed may not be a complete list. Talk with your dietitian about what dietary choices are best for you. Grains Whole-grain or whole-wheat bread. Whole-grain or whole-wheat pasta. Brown rice. Orpah Cobb. Bulgur. Whole-grain and low-sodium cereals. Pita bread. Low-fat, low-sodium crackers. Whole-wheat flour tortillas. Vegetables Fresh or frozen vegetables (raw, steamed, roasted, or grilled). Low-sodium or reduced-sodium tomato and vegetable juice.  Low-sodium or reduced-sodium tomato sauce and tomato paste. Low-sodium or reduced-sodium canned vegetables. Fruits All fresh, dried, or frozen fruit. Canned fruit in natural juice (without added sugar). Meat and other protein foods Skinless chicken or [place]. Ground chicken or [place]. Pork with fat trimmed off. Fish and seafood. Egg whites. Dried beans, peas, or lentils. Unsalted nuts, nut butters, and seeds. Unsalted canned beans. Lean cuts of beef with fat trimmed off. Low-sodium, lean deli meat. Dairy Low-fat (1%) or fat-free (skim) milk. Fat-free, low-fat, or reduced-fat cheeses. Nonfat, low-sodium ricotta or cottage cheese. Low-fat or nonfat yogurt. Low-fat, low-sodium cheese. Fats and oils Soft margarine without trans fats. Vegetable oil. Low-fat, reduced-fat, or light mayonnaise and salad dressings (reduced-sodium). Canola, safflower, olive, soybean, and sunflower oils. Avocado. Seasoning and other foods Herbs. Spices. Seasoning mixes without salt. Unsalted popcorn and pretzels. Fat-free sweets. What foods are not recommended? The items listed may not be a complete list. Talk with your dietitian about what dietary choices are best for you. Grains Baked goods made with fat, such as croissants, muffins, or some breads. Dry pasta or rice meal packs. Vegetables Creamed or fried vegetables. Vegetables in a cheese sauce. Regular canned vegetables (not low-sodium or reduced-sodium). Regular canned tomato sauce and paste (not low-sodium or reduced-sodium). Regular tomato and vegetable juice (not low-sodium or reduced-sodium). Rosita Fire. Olives. Fruits Canned fruit in a light or heavy syrup. Fried fruit. Fruit in cream or butter sauce. Meat and other protein foods Fatty cuts of meat. Ribs. Fried meat. Tomasa Blase. Sausage. Bologna and other processed lunch meats. Salami. Fatback. Hotdogs. Bratwurst. Salted nuts and seeds. Canned beans with added salt. Canned or smoked fish. Whole eggs or egg yolks. Chicken  or [place] with skin. Dairy Whole or 2% milk, cream, and half-and-half. Whole or full-fat cream cheese. Whole-fat or sweetened yogurt. Full-fat cheese. Nondairy creamers. Whipped toppings. Processed cheese and cheese spreads. Fats and oils Butter. Stick margarine. Lard. Shortening. Ghee. Bacon fat. Tropical oils, such as coconut, palm kernel, or palm oil. Seasoning and other foods Salted popcorn and pretzels. Onion salt, garlic salt, seasoned salt, table salt, and sea salt. Worcestershire sauce. Tartar sauce. Barbecue sauce. Teriyaki sauce. Soy sauce, including reduced-sodium. Steak sauce. Canned and packaged gravies. Fish sauce. Oyster sauce. Cocktail sauce. Horseradish that you find on the shelf. Ketchup. Mustard. Meat flavorings and tenderizers. Bouillon cubes. Hot sauce and Tabasco sauce. Premade or packaged marinades. Premade or packaged taco seasonings. Relishes. Regular salad dressings. Where to find more information: National Heart, Lung, and Blood Institute: PopSteam.is American Heart Association: www.heart.org Summary The DASH eating plan is a healthy eating plan that has been shown to reduce high blood pressure (hypertension). It may also reduce your risk for type 2 diabetes, heart disease, and stroke. With the DASH eating plan, you should limit salt (sodium) intake to 2,300 mg a day. If you have hypertension, you may need to reduce your sodium intake  to 1,500 mg a day. When on the DASH eating plan, aim to eat more fresh fruits and vegetables, whole grains, lean proteins, low-fat dairy, and heart-healthy fats. Work with your health care provider or diet and nutrition specialist (dietitian) to adjust your eating plan to your individual calorie needs. This information is not intended to replace advice given to you by your health care provider. Make sure you discuss any questions you have with your health care provider. Document Released: 03/20/2011 Document Revised: 03/13/2017 Document  Reviewed: 03/24/2016 Elsevier Patient Education  2020 [employer].

## 2022-12-16 NOTE — Progress Notes (Signed)
Cardiology Office Note:  .    Date:  12/16/2022  ID:  Rebecca Fry, DOB [birth year], MRN [id number] PCP: Rebecca Rakers, MD  [facility] Providers Cardiologist:  None     History of Present Illness: .    Rebecca Fry is a 37 y.o. female with hypertension, prediabetes, anxiety, and gout, here for an evaluation of shortness of breath and palpitations. She saw her PCP, Dr. Parke Fry, 08/2022 and noted the symptoms. EKG at the time showed sinus rhythm with no arrhythmias. Labs were notable for hypercholesterolemia and normal renal function and electrolytes. Hemoglobin A1c was 5.9%.   She was seen by vascular surgery 10/2022 given RLE chronic venous insufficiency with varicose veins. She had presented to urgent care in 08/2022 due to leg pain which she felt was related. LE duplex was negative for DVT. They discussed ablation but it was recommended to treat conservatively.   Today, she confirms experiencing intermittent episodes of chest pain with radiation to her right arm and numbness in the right fingers. Initial onset was about 3 months ago, and her chest pain most recently occurred last night. Her chest pain may occur with associated shortness of breath and diaphoresis. Rarely she has developed chest pain with exertion but it mostly happens when she is still or at rest. Typically her episodes may last for 5-7 minutes before subsiding and recurring a couple hours later. However, her episode last night was atypical, lasting for 20 minutes and associated with vomiting; she was lying down in bed at onset. Her blood pressure has been more difficult to control, especially since her pregnancy. She would always be on bedrest for elevated blood pressures near the end of her pregnancies, although she was never diagnosed with preeclampsia. Currently on amlodipine 5 mg twice daily for hypertension. Her blood pressure in the office is initially 138/92, and on manual recheck is 154/90 (right arm), 156/88 (left  arm). She notes that the blood pressure in her right arm is often higher than in her left. At home, she reports a range of 120/74 to 223/117. Blood pressures tend to be higher at mid day, and by the end of the week (which she relates to work fatigue/stress). She has tried other antihypertensives that were mostly ineffective, noted to be higher when she was on more antihypertensives. Previously tried telmisartan, HCTZ, olmesartan, amlodipine/HCTZ. HCTZ was discontinued due to gout. No further issues with gout now that she is on a consistent medication regimen and monitoring her diet. She has been trying to make dietary changes to cut back on canned foods and other pre-made meals. She is not drinking coffee. No smoking history. For exercise she has been going on walks about 2-3 times a week. Usually she feels well while walking without anginal symptoms. She confirms severe snoring. Previously had a sleep study with borderline sleep apnea. She denies any palpitations, lightheadedness, headaches, syncope, orthopnea, or PND.  ROS:  Please see the history of present illness. All other systems are reviewed and negative.  (+) Intermittent chest pain (+) Shortness of breath (+) Diaphoresis  Studies Reviewed: Marland Kitchen   EKG Interpretation Date/Time:  Tuesday December 16 2022 10:05:33 EDT Ventricular Rate:  74 PR Interval:  160 QRS Duration:  94 QT Interval:  402 QTC Calculation: 446 R Axis:   55  Text Interpretation: Normal sinus rhythm Normal ECG When compared with ECG of 30-Jan-2016 22:41, PREVIOUS ECG IS PRESENT Confirmed by Rebecca Fry ([id number]) on 12/16/2022 12:17:21 PM   EKG 12/16/22: Sinus  rhythm.  Rate 74 bpm.    Risk Assessment/Calculations:     HYPERTENSION CONTROL Vitals:   12/16/22 0954 12/16/22 1022 12/16/22 1023  BP: (!) 138/92 (!) 154/90 (!) 156/88    The patient's blood pressure is elevated above target today.  In order to address the patient's elevated BP: A new medication was  prescribed today.          Physical Exam:    VS:  BP (!) 156/88 (BP Location: Left Arm, Patient Position: Sitting, Cuff Size: Large)   Pulse 74   Ht 5\' 5"  (1.651 m)   Wt 250 lb (113.4 kg)   BMI 41.60 kg/m  , BMI Body mass index is 41.6 kg/m. GENERAL:  Well appearing HEENT: Pupils equal round and reactive, fundi not visualized, oral mucosa unremarkable NECK:  No jugular venous distention, waveform within normal limits, carotid upstroke brisk and symmetric, no bruits, no thyromegaly LUNGS:  Clear to auscultation bilaterally HEART:  RRR.  PMI not displaced or sustained,S1 and S2 within normal limits, no S3, no S4, no clicks, no rubs, no murmurs ABD:  Flat, positive bowel sounds normal in frequency in pitch, no bruits, no rebound, no guarding, no midline pulsatile mass, no hepatomegaly, no splenomegaly EXT:  2 plus pulses throughout, no edema, no cyanosis no clubbing SKIN:  No rashes no nodules NEURO:  Cranial nerves II through XII grossly intact, motor grossly intact throughout PSYCH:  Cognitively intact, oriented to person place and time  Wt Readings from Last 3 Encounters:  12/16/22 250 lb (113.4 kg)  11/11/22 255 lb 12.8 oz (116 kg)  01/30/16 230 lb (104.3 kg)     ASSESSMENT AND PLAN: .       # Chest Pain and Palpitations Episodes of chest pain, palpitations, and shortness of breath occurring intermittently over the past three months, lasting 5-7 minutes, and sometimes associated with numbness in the arm and fingers. Episodes occur both at rest and with exertion. No associated nausea, but occasional vomiting and sweating. -Order coronary CTA to rule out coronary artery disease.  # Hypertension History of difficult-to-control hypertension, currently on Amlodipine 5mg  twice daily. Blood pressure readings at home are very labile and range from 223/117 to 120/74, with higher readings typically later in the week and midday. Family history of hypertension. -Add Spironolactone 25mg   daily to current regimen. -Check BMP in one week. -Rebecca Fry consents to enrolling in our Remote patient monitoring study and using the Viv[employer] remote patient monitoring system. -Follow-up in hypertension clinic in one month.  # Gout History of gout, currently well-controlled with medication and dietary modifications. -Monitor for gout flare-ups with the addition of Spironolactone.  # Hypertriglyceridemia Noted on recent lab work. -Continue monitoring and lifestyle modifications.         Dispo:  FU with APP/PharmD in 1 month for the next 3 months. FU with Keelan Tripodi C. Duke Salvia, MD, [facility] in 4 months.  I,Mathew Stumpf,acting as a Neurosurgeon for Rebecca Si, MD.,have documented all relevant documentation on the behalf of Rebecca Si, MD,as directed by  Rebecca Si, MD while in the presence of Rebecca Si, MD.  I, Rodriguez Aguinaldo C. Duke Salvia, MD have reviewed all documentation for this visit.  The documentation of the exam, diagnosis, procedures, and orders on 12/16/2022 are all accurate and complete.   Signed, Rebecca Si, MD

## 2022-12-16 NOTE — Progress Notes (Deleted)
  Cardiology Office Note:  .   Date:  12/16/2022  ID:  Rebecca Fry, DOB December 11, 1985, MRN [id number] PCP: Renaye Rakers, MD  [facility] Providers Cardiologist:  None { Click to update primary MD,subspecialty MD or APP then REFRESH:1}   History of Present Illness: .   Rebecca Fry is a 37 y.o. female with hypertension, prediabetes, anxiety, and gout here for an evaluation of shortness of breath and palpitations.  She saw her PCP, Dr. Parke Simmers, 08/2022 and noted the symptoms.  EKG at the time showed sinus rhythm with no arrhythmias.  Labs were notable for hypercholesterolemia and normal renal function and electrolytes.  Hemoglobin A1c was 5.9%.  ROS: ***  Studies Reviewed: .        *** Risk Assessment/Calculations:   {Does this patient have ATRIAL FIBRILLATION?[phone]} No BP recorded.  {Refresh Note OR Click here to enter BP  :1}***       Physical Exam:   VS:  There were no vitals taken for this visit.   Wt Readings from Last 3 Encounters:  11/11/22 255 lb 12.8 oz (116 kg)  01/30/16 230 lb (104.3 kg)  10/20/14 246 lb (111.6 kg)    GEN: Well nourished, well developed in no acute distress NECK: No JVD; No carotid bruits CARDIAC: ***RRR, no murmurs, rubs, gallops RESPIRATORY:  Clear to auscultation without rales, wheezing or rhonchi  ABDOMEN: Soft, non-tender, non-distended EXTREMITIES:  No edema; No deformity   ASSESSMENT AND PLAN: .   ***    {Are you ordering a CV Procedure (e.g. stress test, cath, DCCV, TEE, etc)?   Press F2        :[id number]}  Dispo: ***  Signed, Chilton Si, MD

## 2022-12-17 ENCOUNTER — Telehealth: Payer: Self-pay

## 2022-12-17 DIAGNOSIS — Z0000 Encounter for general adult medical examination without abnormal findings: Secondary | ICD-10-CM

## 2022-12-17 NOTE — Telephone Encounter (Signed)
Called patient to conduct [employer] welcome call and offer health coaching per survey response regarding not having an eating plan. Patient prompt times did not need adjusting. Patient mentioned that she has to enter her readings manually. Offered to submit a ticket for tech support. Patient shared that she is okay with manually entering her readings and declined tech support. Patient stated that she will try uninstalling and reinstalling the app to see if that helps. Patient stated that she did discuss her eating habits with Dr. Duke Salvia during her visit and expressed that she is interested in participating in health coaching. Patient has been scheduled for an appointment over the phone on 9/13 at 1:00pm. Patient will be called at that time.   Renaee Munda, MS, ERHD, [facility]  Care Guide, Health & Wellness Coach [address] [place] [address] Telephone: [phone] Email: [email or web address]

## 2022-12-23 ENCOUNTER — Telehealth: Payer: Self-pay | Admitting: Cardiovascular Disease

## 2022-12-23 ENCOUNTER — Telehealth (HOSPITAL_BASED_OUTPATIENT_CLINIC_OR_DEPARTMENT_OTHER): Payer: Self-pay

## 2022-12-23 ENCOUNTER — Encounter (HOSPITAL_COMMUNITY): Payer: Self-pay

## 2022-12-23 DIAGNOSIS — Z0000 Encounter for general adult medical examination without abnormal findings: Secondary | ICD-10-CM

## 2022-12-23 NOTE — Telephone Encounter (Signed)
Patient had called to reschedule telephonic health coaching appointment on 9/13 to an earlier time. Patient was confused about the appointments that she had because they were scheduled for three days consecutively. Explained to patient that her appointment on 9/12 is in-person at [facility] with the pharmacist and the health coaching appointment on 9/13 is over the phone. Patient expressed verbal understanding. Patient's appointment on 9/13 has been rescheduled to 9:00am the same day.   Renaee Munda, MS, ERHD, [facility]  Care Guide, Health & Wellness Coach [address] [place] [address] Telephone: [phone] Email: [email or web address]

## 2022-12-23 NOTE — Telephone Encounter (Signed)
Patient requested to reschedule health coaching appointment on 9/13 to an earlier time. Appointment with pharmacist at [facility] on 9/12 remains the same.

## 2022-12-23 NOTE — Telephone Encounter (Signed)
Patient is calling because she had a nurse visit scheduled for Friday at 1PM. Patient would like to know if she could come in on Friday at an earlier time. Please advise.

## 2022-12-24 ENCOUNTER — Ambulatory Visit (HOSPITAL_COMMUNITY)
Admission: RE | Admit: 2022-12-24 | Discharge: 2022-12-24 | Disposition: A | Discharge status: Home / Self Care | Payer: BC Managed Care – PPO | Source: Ambulatory Visit | Attending: Cardiovascular Disease | Admitting: Cardiovascular Disease

## 2022-12-24 DIAGNOSIS — R072 Precordial pain: Secondary | ICD-10-CM | POA: Diagnosis not present

## 2022-12-24 DIAGNOSIS — R079 Chest pain, unspecified: Secondary | ICD-10-CM | POA: Diagnosis present

## 2022-12-24 MED ORDER — NITROGLYCERIN 0.4 MG SL SUBL
0.8000 mg | SUBLINGUAL_TABLET | Freq: Once | SUBLINGUAL | Status: AC
  Administered 2022-12-24: 0.8 mg via SUBLINGUAL

## 2022-12-24 MED ORDER — NITROGLYCERIN 0.4 MG SL SUBL
SUBLINGUAL_TABLET | SUBLINGUAL | Status: AC
  Filled 2022-12-24: qty 2

## 2022-12-24 MED ORDER — IOHEXOL 350 MG/ML SOLN
95.0000 mL | Freq: Once | INTRAVENOUS | Status: AC | PRN
  Administered 2022-12-24: 95 mL via INTRAVENOUS

## 2022-12-25 ENCOUNTER — Encounter (HOSPITAL_BASED_OUTPATIENT_CLINIC_OR_DEPARTMENT_OTHER): Payer: Self-pay | Admitting: Pharmacist Clinician (PhC)/ Clinical Pharmacy Specialist

## 2022-12-25 ENCOUNTER — Ambulatory Visit (HOSPITAL_BASED_OUTPATIENT_CLINIC_OR_DEPARTMENT_OTHER): Payer: BC Managed Care – PPO | Admitting: Pharmacist Clinician (PhC)/ Clinical Pharmacy Specialist

## 2022-12-25 VITALS — BP 142/83 | HR 72 | Ht 65.5 in | Wt 247.5 lb

## 2022-12-25 DIAGNOSIS — I10 Essential (primary) hypertension: Secondary | ICD-10-CM

## 2022-12-25 LAB — BASIC METABOLIC PANEL
BUN/Creatinine Ratio: 13 (ref 9–23)
BUN: 9 mg/dL (ref 6–20)
CO2: 22 mmol/L (ref 20–29)
Calcium: 9.6 mg/dL (ref 8.7–10.2)
Chloride: 100 mmol/L (ref 96–106)
Creatinine, Ser: 0.71 mg/dL (ref 0.57–1.00)
Glucose: 85 mg/dL (ref 70–99)
Potassium: 4.6 mmol/L (ref 3.5–5.2)
Sodium: 137 mmol/L (ref 134–144)
eGFR: 111 mL/min/{1.73_m2} (ref >59)

## 2022-12-25 NOTE — Patient Instructions (Signed)
  Go to the lab today to check kidney function  Take your BP meds as follows: Continue current medications today. I will call once we get the results of your blood work about increasing the spironolactone or adding a different medication  Check your blood pressure at home daily (if able) and keep record of the readings.  Hypertension "High blood pressure"  Hypertension is often called "The Silent Killer." It rarely causes symptoms until it is extremely  high or has done damage to other organs in the body. For this reason, you should have your  blood pressure checked regularly by your physician. We will check your blood pressure  every time you see a provider at one of our offices.   Your blood pressure reading consists of two numbers. Ideally, blood pressure should be  below 120/80. The first ("top") number is called the systolic pressure. It measures the  pressure in your arteries as your heart beats. The second ("bottom") number is called the diastolic pressure. It measures the pressure in your arteries as the heart relaxes between beats.  The benefits of getting your blood pressure under control are enormous. A 10-point  reduction in systolic blood pressure can reduce your risk of stroke by 27% and heart failure by 28%  Your blood pressure goal is < 130/80  To check your pressure at home you will need to:  1. Sit up in a chair, with feet flat on the floor and back supported. Do not cross your ankles or legs. 2. Rest your left arm so that the cuff is about heart level. If the cuff goes on your upper arm,  then just relax the arm on the table, arm of the chair or your lap. If you have a wrist cuff, we  suggest relaxing your wrist against your chest (think of it as Pledging the Flag with the  wrong arm).  3. Place the cuff snugly around your arm, about 1 inch above the crook of your elbow. The  cords should be inside the groove of your elbow.  4. Sit quietly, with the cuff in  place, for about 5 minutes. After that 5 minutes press the power  button to start a reading. 5. Do not talk or move while the reading is taking place.  6. Record your readings on a sheet of paper. Although most cuffs have a memory, it is often  easier to see a pattern developing when the numbers are all in front of you.  7. You can repeat the reading after 1-3 minutes if it is recommended  Make sure your bladder is empty and you have not had caffeine or tobacco within the last 30 min  Always bring your blood pressure log with you to your appointments. If you have not brought your monitor in to be double checked for accuracy, please bring it to your next appointment.  You can find a list of quality blood pressure cuffs at validatebp.org

## 2022-12-25 NOTE — Progress Notes (Signed)
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 female patient who was referred to the [facility] by Derenda Mis NP.  Dr. Duke Salvia saw her two weeks ago, at which time her pressure was 156/88.  She had been having some intermittent chest pain and a coronary CTA showed calcium score of 0.    Today she is in for follow up.  Dr. Duke Salvia started her on spironolactone early last week and in Viv[employer] her home readings were much improved over the past few days and she has had a couple of readings < 130/80.  Today she tells me that her home life is hectic, as she has 3 kids in middle and high schools, at three different schools and all playing different sports.  She often doesn't get home and dinner ready until 8-9 pm.    Blood Pressure Goal:  130/80  Current Medications: amlodipine 5 mg bid, spironolactone 25 mg every day - am  Previously tried:   telmisartan, olmesartan - both ineffective, hctz - gout  Family Hx: both parents with hypertension; mother has hypotension issues; 2 borhters, 1 with hyeprtension; 3 kids - 17,14,12  Social Hx:      Tobacco: no  Alcohol: no  Caffeine:no coffee or tea, no soda, only occasional ginger ale  Diet:    mostly home cooked, only 1-2 times out at most - sit down; minimal salt while cooking; variety of proteins; vegetables fresh or frozen; admits to snacking - chips, slim jims, fruit cups; - graze at work with no  breakfast and lunch, dinner as late as 9 pm  Exercise: no, too busy chasing kids, walk 10-15 min couple of days per week   Home BP readings: in Viv[employer]:   9 day average 146/92 HR 72  (range 121-187/69-122)  Accessory Clinical Findings    In [facility]  07/23/22:  Na 139, K 4.6, Glu 77, BUN 12, SCr 0.5  Screening for Secondary Hypertension:      Relevant Labs/Studies:    Latest Ref Rng & Units 01/30/2016   11:23 PM 10/20/2014    3:53 PM 03/07/2014   10:45 PM  Basic Labs  Sodium 135 - 145 mmol/L 139  135  139   Potassium 3.5 - 5.1 mmol/L 3.5  4.3  4.1   Creatinine 0.44 - 1.00 mg/dL 7.37  1.06  2.69        Latest Ref Rng & Units 03/15/2012    5:16 PM  Thyroid   TSH 0.350 - 4.500 uIU/mL 1.551                   Home Medications    Current Outpatient Medications  Medication Sig Dispense Refill   acetaminophen (TYLENOL) 325 MG tablet Take 2 tablets (650 mg total) by mouth every 6 (six) hours as needed for moderate pain. 30 tablet 0   albuterol (VENTOLIN HFA) 108 (  90 Base) MCG/ACT inhaler TAKE 2 PUFFS EVERY 4-6 HOURSS AS NEEDED     allopurinol (ZYLOPRIM) 100 MG tablet Take 100 mg by mouth daily.     ALPRAZolam (XANAX) 0.5 MG tablet Take 0.5 mg by mouth at bedtime as needed for anxiety.     amitriptyline (ELAVIL) 25 MG tablet Take 25 mg by mouth at bedtime.     amLODipine (NORVASC) 5 MG tablet Take 5 mg by mouth in the morning and at bedtime.     spironolactone (ALDACTONE) 25 MG tablet Take 1 tablet (25 mg total) by mouth daily. 90 tablet 3   No current facility-administered medications for this visit.     Assessment & Plan   HYPERTENSION CONTROL Vitals:   12/25/22 0943  BP: (!) 142/83    The patient's blood pressure is elevated above target today.  In order to address the patient's elevated BP:       Hypertension Assessment: BP is uncontrolled in office BP 142/83 mmHg;  above the goal (<130/80). Tolerates amlodipine and spironolactone well without any side  effects Denies SOB, palpitation, chest pain, headaches,or swelling Reiterated the importance of regular exercise and low salt diet   Plan:  Continue taking current medications Would like to increase spironolactone to 50 mg daily, but need to see results of BMET before increasing dose.  (K at 4.6 prior to starting spironolactone) Patient to keep record of BP readings with heart rate and report to [place] at the next visit. Discussed options for better snacking at work - healthy choices that can also go in the kids' lunches Patient to follow up with PharmD in 2 months  Labs ordered today:  BMET   Phillips Hay PharmD CPP [facility]  [address] [place], [place] [address] [phone]

## 2022-12-25 NOTE — Assessment & Plan Note (Signed)
Assessment: BP is uncontrolled in office BP 142/83 mmHg;  above the goal (<130/80). Tolerates amlodipine and spironolactone well without any side effects Denies SOB, palpitation, chest pain, headaches,or swelling Reiterated the importance of regular exercise and low salt diet   Plan:  Continue taking current medications Would like to increase spironolactone to 50 mg daily, but need to see results of BMET before increasing dose.  (K at 4.6 prior to starting spironolactone) Patient to keep record of BP readings with heart rate and report to [place] at the next visit. Discussed options for better snacking at work - healthy choices that can also go in the kids' lunches Patient to follow up with PharmD in 2 months  Labs ordered today:  BMET

## 2022-12-26 ENCOUNTER — Ambulatory Visit: Payer: BC Managed Care – PPO

## 2022-12-31 ENCOUNTER — Telehealth: Payer: Self-pay

## 2022-12-31 ENCOUNTER — Telehealth: Payer: Self-pay | Admitting: Pharmacist Clinician (PhC)/ Clinical Pharmacy Specialist

## 2022-12-31 DIAGNOSIS — I10 Essential (primary) hypertension: Secondary | ICD-10-CM

## 2022-12-31 DIAGNOSIS — Z0000 Encounter for general adult medical examination without abnormal findings: Secondary | ICD-10-CM

## 2022-12-31 MED ORDER — SPIRONOLACTONE 50 MG PO TABS: 50.0000 mg | ORAL_TABLET | Freq: Every day | ORAL | 6 refills | Status: DC

## 2022-12-31 NOTE — Telephone Encounter (Signed)
LMOM to have patient increase spironolactone to 50 mg and repeat BMET in another 2 weeks

## 2022-12-31 NOTE — Telephone Encounter (Signed)
Called patient to reschedule health coaching appointment from 9/13. Patient did not answer. Left message for patient to return call.   Renaee Munda, MS, ERHD, [facility]  Care Guide, Health & Wellness Coach [address] [place] [address] Telephone: [phone] Email: [email or web address]

## 2023-01-01 ENCOUNTER — Telehealth: Payer: Self-pay

## 2023-01-01 DIAGNOSIS — Z0000 Encounter for general adult medical examination without abnormal findings: Secondary | ICD-10-CM

## 2023-01-01 NOTE — Telephone Encounter (Signed)
Called patient to reschedule health coaching session from 9/13. Patient requested to be scheduled on 9/24 at 9:00am. Patient's appointment has been updated and will be called at this time.   Renaee Munda, MS, ERHD, [facility]  Care Guide, Health & Wellness Coach [address] [place] [address] Telephone: [phone] Email: [email or web address]

## 2023-01-06 ENCOUNTER — Ambulatory Visit: Payer: BC Managed Care – PPO | Attending: Cardiovascular Disease

## 2023-01-06 DIAGNOSIS — Z0000 Encounter for general adult medical examination without abnormal findings: Secondary | ICD-10-CM

## 2023-01-06 NOTE — Progress Notes (Signed)
HEALTH & WELLNESS COACHING INITIAL INTAKE                Appointment Outcome: Completed, Session #: Initial                        Start time: 9:00am   End time: 9:55am   Total Mins: 55 minutes    What are the Patient's goals from Coaching?  Patient stated that she wants to improve her overall health by changing her eating habits to lose weight and maintain it.    Why did they seek coaching now? Patient stated that she a challenge with eating one meal per day late at night and unhealthy snacking during the day. Patient shared that she also adds sugar to some of her foods.    Readiness  What stage is the patient in regarding their goal(s)?  Contemplation - Patient is in the process of figuring out what she can do to improve her eating habits.    Coaching Progress Notes:  Patient explained that she has a busy schedule each day that starts at 5:30am and she ends her day at 12:30-1am. Patient mentioned that she does not have down time during the day or weekends due to work, children being involved in extracurricular activities, and other responsibilities. Patient mentioned that she typically eats one meal per day around between 9:30-10:30pm. Patient stated that she snacks during the day to get by until she eats and have something on the go but does not choose healthy options. Patient shared that plan her meals and grocery shop on Sunday or Monday each week. Patient stated that she typically plans her meals for either 4 or 6 days at a time depending on her weekly schedule. Patient shared that on Sundays, she eats fast food because she is out all day and does not get home until 7pm or 8pm.   Patient stated that she can lose weight easily by practicing intermittent fasting and adopting a low carb diet. Patient shared that her issue is not being able to be consistent with her dietary changes. Patient mentioned that when she grocery shops, she purchases fresh and frozen goods and not canned items.  Patient stated that when she is really focused on improving her diet, she read food labels, but not on a regular basis. Patient shared that she eats what she cooks for her children or eat a salad depending on the time. Patient stated that she loves a variety of vegetables, and her children will share with her the fruits that they want prior to grocery shopping (e.g., apples, pineapples, strawberries, bananas).   Patient shared that she does not use salt when she prepares her meals but uses powders such as onion and garlic. Patient stated that her cardiologist informed her not to use anything to season her food that has "salt" in the name. Patient stated that sugar consumption is what she struggles with most. Patient shared that she adds sugar to certain foods because she prefers them to be on the sweeter side. Patient stated that she refrains from drinking sweetened beverages. Patient expressed that she wants to improve her overall health and lose weight that she can maintain.     Coaching Outcomes Discussed with patient the sodium content in her food choice when she eats fast food. Patient was unaware the amount of sodium in a salad that she would normally eat from [employer]. Reviewed with the patient the DASH diet that was recommended by  Dr. Duke Salvia during her visit on 9/3. Discussed with patient the recommendation of 1500mg  of sodium per day for patients with hypertension.   Discussed with patient the healthy behaviors she wanted to focus on, which included reading food labels, practicing portion control per recommended serving size, cutting back on sugar consumption, and unhealthy snacking.  Patient decided that she wants to focus on improving her snacking choices at work. Patient stated that she will replace her snack choices at work with fresh fruits and vegetables. Patient shared that to save her time, she will purchase fruit and vegetable trays and make snack bags to take with her to work.    Patient is also wanting to focus on reducing her sugar consumption. Discuss how reading food labels to determine the amount of sugar that a product contains per serving can be useful. Patient was informed that the American Heart Association recommends that women consume no more than 25 grams of added sugar per day. Discussed how practicing portion control per serving size could be helpful in reducing sugar consumption.      AGREEMENTS SECTION   Overall Goal(s): Improve eating habits by choosing healthy snacks for work 5 days/week                          Agreement/Action Steps:  Meal plan on Sunday or Monday Purchase fresh fruit and vegetable trays Make snack bags for 5 days/week when meal prepping     Agreement Signed & Returned? Reviewed Coaching Agreement and Code of Ethics with Patient during initial session. Answered any questions the patient had if any regarding the Coaching Agreement and Code of Ethics. Patient verbally agreed to adhere to the Coaching Agreement and to abide by the Code of Ethics.  Mailed patient with a hard/electronic copy of the Coaching Agreement and Code of Ethics.  Resources: Patient was mailed supporting documents to help understand the concepts of how to read a food label properly, practice portion control, information of the DASH diet, and meal planning sheets.   Referrals: NA

## 2023-01-20 ENCOUNTER — Ambulatory Visit: Payer: BC Managed Care – PPO | Attending: Cardiovascular Disease

## 2023-01-20 DIAGNOSIS — Z0000 Encounter for general adult medical examination without abnormal findings: Secondary | ICD-10-CM

## 2023-01-20 NOTE — Progress Notes (Signed)
Appointment Outcome: Completed, Session #: 1                         Start time: 9:01am   End time: 9:24am   Total Mins: 23 minutes  AGREEMENTS SECTION   Overall Goal(s): Improve eating habits by choosing healthy snacks for work 5 days/week                           Agreement/Action Steps:  Meal plan on Sunday or Monday Purchase fresh fruit and vegetable trays Make snack bags for 5 days/week when meal prepping    Progress Notes:  Patient stated that she was able to make snack bags that she took with her on the go because she was going back and forth to the hospital to visit her mother. Patient stated that she rarely packs lunch to take to work. Patient stated that she was bringing her snack bags with her to visit her mother but realized that her father also was bringing the same items, so she began to eat what he was bringing instead of making her own to bring.   Discussed with patient how she would be able to remain consistent with bringing a snack bag to work since she ran into this challenge over the past two weeks. Patient stated that her challenge is prepping ahead of time. Patient stated that purchasing the fruit (cantaloupe, strawberries, grapes, bananas, and apples) and vegetable trays ahead of time on Sunday was helpful, but she needs to set aside a specific time to prep her snacks for work.   Patient shared that she refrained from eating fast food while having to be on the go a lot between visiting her mother. Patient stated that she prepared meals at her mother's house that consisted of chicken, salmon, steak, and vegetables such as green beans, peas, broccoli, and she ate a variety of salads. Patient stated that she rarely had a carbohydrate for dinner, but if she did it was brown rice.      Indicators of Success and Accountability:  Patient is motivated to making healthy changes to her eating habits that she is making healthy food choices for other meal options and has been  motivated to increase her physical activity.   Readiness: Patient is in the action stage of improving her healthy eating habits.   Strengths and Supports: Patient is being supported by her family. Patient is relying on her being structured and a planner.   Challenges and Barriers: Patient stated that being willing to prep her snacks ahead of time instead of waiting until the morning of will be a challenge to engaging in this behavior change.      Coaching Outcomes: Patient wants to continue working on improving her eating habits at work. Patient stated because she doesn't typically eat lunch at work, she snacks and bringing healthy snacks is the most helpful at this time. Discussed with patient would be helpful to aid her in being more consistent with continuing this behavior. Patient stated that having a certain time that she preps her snacks would be the best. Discussed with patient her routine schedule. Patient deemed it most appropriate to prep her fruit/vegetable snacks the night before bed around 9:30pm-10pm to grab and go the next morning for work.   Patient stated that she wants to start incorporating 30 minutes of walking daily. Discussed with patient the feasibility of walking daily and what that  would look like with her current schedule. Patient stated that it would be easier to break down the 30 minutes into increments of 10 minutes throughout the day depending on her schedule. Patient felt that it would be more manageable to start walking three days a week instead of every day. Patient choose to schedule her walks on Monday, Wednesday, and Friday.  Patient will implement the following action plan over the next two weeks as outlined below.    Agreement/Action Steps:  Improve healthy eating habits Meal plan on Sunday or Monday Purchase fresh fruit and vegetable trays Make snack bags for 5 days/week when meal prepping Prep snack bags between 9:30-10pm the night before  work  Increase physical activity Walk for 30 minutes 3 days per week Break walks up into 10-minute intervals throughout the day Walk on Monday, Wednesday, and Friday    Attempted: Partial - Patient did make snack bags on occasions, but stopped bringing them with her when they were made available by someone else. Patient however, continued to eat fruit and vegetables as snacks.

## 2023-01-21 ENCOUNTER — Ambulatory Visit
Admission: RE | Admit: 2023-01-21 | Discharge: 2023-01-21 | Disposition: A | Discharge status: Home / Self Care | Payer: BC Managed Care – PPO | Source: Ambulatory Visit | Attending: Family Medicine | Admitting: Family Medicine

## 2023-01-21 ENCOUNTER — Other Ambulatory Visit: Payer: Self-pay | Admitting: Family Medicine

## 2023-01-21 DIAGNOSIS — R0602 Shortness of breath: Secondary | ICD-10-CM

## 2023-02-03 ENCOUNTER — Ambulatory Visit: Payer: BC Managed Care – PPO

## 2023-02-03 ENCOUNTER — Telehealth: Payer: Self-pay

## 2023-02-03 DIAGNOSIS — Z0000 Encounter for general adult medical examination without abnormal findings: Secondary | ICD-10-CM

## 2023-02-03 NOTE — Telephone Encounter (Signed)
Called patient to hold health coaching appointment. Was not able to reach patient or leave a message due to voicemail being full.    Renaee Munda, MS, ERHD, [facility]  Care Guide, Health & Wellness Coach [address] [place] [address] Telephone: [phone] Email: [email or web address]

## 2023-02-06 ENCOUNTER — Telehealth (HOSPITAL_BASED_OUTPATIENT_CLINIC_OR_DEPARTMENT_OTHER): Payer: Self-pay | Admitting: *Deleted

## 2023-02-06 NOTE — Telephone Encounter (Signed)
Hey Juliette Alcide, it's Kristin from [facility]. One of our patients, Rebecca Fry DOB 1985-10-21 has responded that she has questions about her meds.   Above message received via secure chat, left message to call back

## 2023-02-09 NOTE — Telephone Encounter (Signed)
Received secure chat from Renaee Munda that patient has concerns about her medications, requesting call from the RN.   2nd call attempt, no answer, left message to call back.

## 2023-02-09 NOTE — Progress Notes (Signed)
Communicated with patient via [employer] remote monitoring for blood pressure regarding her having questions about her medications. Informed patient that a nurse had attempted to contact her on 10/25 and left her a message. Patient stated that she did not receive a call or message. Advised patient to contact the office to discuss her medication questions. Inquired when patient would like to reschedule her health coaching appointment that was missed on 10/22. Patient stated that she would be available on 10/31 at 9:00am. Patient has been scheduled accordingly.    Renaee Munda, MS, ERHD, [facility]  Care Guide, Health & Wellness Coach [address], [address] [place] [address] Telephone: [phone] Email: [email or web address]

## 2023-02-10 NOTE — Telephone Encounter (Signed)
3rd call attempt to patient,    Patient states her stomach seems a little upset when she is taking her spironolactone. She is not currently taking it with food. Advised patient to trial taking with food. If no improvement within a week or so to let us know.

## 2023-02-11 ENCOUNTER — Ambulatory Visit: Payer: BC Managed Care – PPO | Admitting: Vascular Surgery

## 2023-02-12 ENCOUNTER — Ambulatory Visit: Payer: BC Managed Care – PPO

## 2023-02-12 DIAGNOSIS — Z0000 Encounter for general adult medical examination without abnormal findings: Secondary | ICD-10-CM

## 2023-02-12 NOTE — Progress Notes (Addendum)
Appointment Outcome: Completed, Session #: 2                        Start time: 9:07am   End time: 9:31am   Total Mins: 24 minutes  AGREEMENTS SECTION   Overall Goal(s): Improve eating habits by choosing healthy snacks for work 5 days/week       Increase exercise to 150 minutes per week    Agreement/Action Steps:  Improve healthy eating habits Meal plan on Sunday or Monday Purchase fresh fruit and vegetable trays Make snack bags for 5 days/week when meal prepping Prep snack bags between 9:30-10pm the night before work   Increase exercise to 150 minutes per week Walk for 30 minutes 3 days per week Break walks up into 10-minute intervals throughout the day Walk on Monday, Wednesday, and Friday     Progress Notes:  Patient stated that making changes to her eating habits has been easier than expected and seems natural because she didn't have to make much of a shift to implement the action steps. Patient reported that she has lost a total of 13 lbs. since making the following changes:  Meal planning dinner for the week so she will be prepared to know what cook in the evenings.  Taking lunch and her snack bags to work with fruits and vegetables. Eating breakfast.   Patient shared that she eats 3 meals per day at least 4 days per week. Patient stated that since she meal plans, she eats at home daily meal except for Sundays. Patient mentioned that after church on Sunday, the family will eat at a restaurant, and she chooses to eat a salad to help maintain her eating habits. Patient shared that she has been able to cut out eating chips by taking fruits and vegetables to eat. Patient expressed that she does not have a challenge with refraining from consuming a lot of sugar and stated that she drinks between 72-96 oz of water by filling her Stanley cup 1.5 to 2 times per day.   Patient explained that she has not been able to exercise due to time constraints. Patient stated that she thought she  would be able to break down 30 minutes of walking into 10-minute increments while at work but has been challenged with time due having scheduled meetings throughout the day. Patient broke down her evening schedule and how it would be challenging to incorporating exercise in between activities with kids, getting them prepared for the next day, and chores.   Discussed with patient strategies that would be helpful in identifying when she would be able to incorporate an exercise routine or if it a realistic goal at this time. Patient decided that she still wants to incorporate exercise because she believes it will be beneficial in helping to reduce her weight more.   Indicators of Success and Accountability: Patient reported a 13 lb reduction in weight since the last session from improving her eating habits over the past month.   Readiness: Patient is in the action stage of improving her eating habits. Patient is in the contemplation stage of increasing her exercise.   Strengths and Supports: Patient is relying on being consistent.  Challenges and Barriers: Patient stated that she has a challenge with finding the time to exercise.    Coaching Outcomes: Patient decided that she would write out her schedule to determine when she would have pockets of time to engage in exercise.   Patient is planning on  using YouTube videos to help her engage in exercise in the mornings around 5:30am when she gets her children up for school.   Patient will continue to implement her action steps for improving her eating habits as they are outlined above over the next two weeks.     Attempted: Fulfilled - Patient completed the action steps towards improving her healthy eating habits.   Not met - Patient was not able to get started on increasing her exercise over the past two weeks.

## 2023-02-25 ENCOUNTER — Ambulatory Visit: Payer: BC Managed Care – PPO

## 2023-02-26 ENCOUNTER — Telehealth: Payer: Self-pay | Admitting: Licensed Clinical Social Worker

## 2023-02-26 ENCOUNTER — Ambulatory Visit: Payer: BC Managed Care – PPO

## 2023-02-26 NOTE — Telephone Encounter (Signed)
CSW contacted patient to inform that Amy Nedra Hai, Health Coach is out of the office unexpectedly and will need to reschedule visit for today. Patient grateful for the call. Lasandra Beech, LCSW, CCSW-MCS [phone]

## 2023-03-03 ENCOUNTER — Telehealth: Payer: Self-pay

## 2023-03-03 DIAGNOSIS — Z0000 Encounter for general adult medical examination without abnormal findings: Secondary | ICD-10-CM

## 2023-03-03 NOTE — Telephone Encounter (Signed)
Called patient to reschedule health coaching appointment missed on 11/14 due to me being out of office. Patient did not answer. Left message for patient to return call.    Renaee Munda, MS, ERHD, [facility]  Care Guide, Health & Wellness Coach [address], [address] [place] [address] Telephone: [phone] Email: [email or web address]

## 2023-03-06 ENCOUNTER — Telehealth (HOSPITAL_BASED_OUTPATIENT_CLINIC_OR_DEPARTMENT_OTHER): Payer: Self-pay

## 2023-03-06 NOTE — Telephone Encounter (Signed)
Left message for patient to call back  

## 2023-03-15 ENCOUNTER — Other Ambulatory Visit (INDEPENDENT_AMBULATORY_CARE_PROVIDER_SITE_OTHER): Payer: Self-pay

## 2023-03-25 ENCOUNTER — Encounter: Payer: Self-pay | Admitting: Pharmacist

## 2023-03-27 ENCOUNTER — Ambulatory Visit: Payer: BC Managed Care – PPO

## 2023-03-30 ENCOUNTER — Ambulatory Visit: Payer: BC Managed Care – PPO | Attending: Cardiovascular Disease

## 2023-03-30 DIAGNOSIS — Z8669 Personal history of other diseases of the nervous system and sense organs: Secondary | ICD-10-CM | POA: Insufficient documentation

## 2023-03-30 DIAGNOSIS — N926 Irregular menstruation, unspecified: Secondary | ICD-10-CM | POA: Insufficient documentation

## 2023-04-15 ENCOUNTER — Other Ambulatory Visit (INDEPENDENT_AMBULATORY_CARE_PROVIDER_SITE_OTHER): Payer: Self-pay

## 2023-04-27 ENCOUNTER — Telehealth: Payer: Self-pay | Admitting: *Deleted

## 2023-05-16 ENCOUNTER — Other Ambulatory Visit (INDEPENDENT_AMBULATORY_CARE_PROVIDER_SITE_OTHER): Payer: Self-pay

## 2023-05-27 ENCOUNTER — Ambulatory Visit (HOSPITAL_BASED_OUTPATIENT_CLINIC_OR_DEPARTMENT_OTHER): Payer: 59 | Admitting: Cardiovascular Disease

## 2023-05-27 ENCOUNTER — Other Ambulatory Visit (HOSPITAL_BASED_OUTPATIENT_CLINIC_OR_DEPARTMENT_OTHER): Payer: 59

## 2023-05-27 ENCOUNTER — Encounter (HOSPITAL_BASED_OUTPATIENT_CLINIC_OR_DEPARTMENT_OTHER): Payer: Self-pay | Admitting: Cardiovascular Disease

## 2023-05-27 VITALS — BP 154/90 | HR 67 | Ht 65.0 in | Wt 257.0 lb

## 2023-05-27 DIAGNOSIS — R002 Palpitations: Secondary | ICD-10-CM

## 2023-05-27 DIAGNOSIS — I1A0 Resistant hypertension: Secondary | ICD-10-CM | POA: Diagnosis not present

## 2023-05-27 DIAGNOSIS — I10 Essential (primary) hypertension: Secondary | ICD-10-CM

## 2023-05-27 HISTORY — DX: Palpitations: R00.2

## 2023-05-27 MED ORDER — CARVEDILOL 12.5 MG PO TABS: 12.5000 mg | ORAL_TABLET | Freq: Two times a day (BID) | ORAL | 3 refills | Status: DC

## 2023-05-27 NOTE — Progress Notes (Signed)
Cardiology Office Note:  .    Date:  05/27/2023  ID:  Rebecca Fry, DOB [birth year], MRN [id number] PCP: Rebecca Rakers, MD  [facility] Providers Cardiologist:  Rebecca Si, MD     History of Present Illness: .    Rebecca Fry is a 38 y.o. female with hypertension, prediabetes, anxiety, and gout, here for follow up.  She was first seen 12/2022 for evaluation of shortness of breath and palpitations. She saw her PCP, Dr. Parke Fry, 08/2022 and noted the symptoms. EKG at the time showed sinus rhythm with no arrhythmias. Labs were notable for hypercholesterolemia and normal renal function and electrolytes. Hemoglobin A1c was 5.9%.   She was seen by vascular surgery 10/2022 given RLE chronic venous insufficiency with varicose veins. She had presented to urgent care in 08/2022 due to leg pain which she felt was related. LE duplex was negative for DVT. They discussed ablation but it was recommended to treat conservatively.  She ntoed intermittent CP and arm numbness.  She was referred for coronary CT-a 12/2022 that revealed a calcium score of 0 and a plaque volume of 42.  Spironolactone was added to her medication regimen.  She was enrolled in our remote patient monitoring study.  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  Spironolactone was increased to 50 mg.  Recent monitoring in the vivify system has been in the 130s to 160s over 70s to 80s.  Averaging in the 140s.  Rebecca Fry is a 38 year old female with hypertension who presents with chest pain and numbness.  She experiences chest pain and numbness, described as 'weird feelings', occurring two to three times a week, sometimes multiple times a day. Each episode lasts about two to three minutes, leaving her feeling lightheaded and unwell for the rest of the day. During these episodes, she experiences shortness of breath. A CT scan ruled out cardiac issues. She monitors her blood  pressure regularly, noting fluctuations between 130 to 160 mmHg at home, while readings at the clinic are normal. She is on six medications, including telmisartan, HCTZ, amlodipine, spironolactone, and propranolol, which was added for palpitations. She has a history of resistant hypertension and is concerned about the accuracy of her home blood pressure monitor.  She reports significant fatigue, averaging two to three hours of sleep per night. She goes to bed late, around 12:30 or 1:00 AM, and wakes up after a couple of hours, unable to return to sleep. 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 A sleep apnea test conducted about a year ago was borderline, and her weight has remained stable since then.  She mentions experiencing itching on her hands and feet, which she suspects might be related to her medication, specifically amitriptyline, although she is uncertain if it is the cause.  She has a history of pneumonia lasting four months, which has affected her breathing. She does not feel she has returned to her baseline respiratory function. She engages in some physical activity, walking during lunch breaks for about 30 to 40 minutes, and reports feeling fine afterward.      ROS:  Please see the history of present illness. All other systems are reviewed and negative.  (+) Intermittent chest pain (+) Shortness of breath (+) Diaphoresis  Prior antihypertensives;  Telmisartan Hydrochlorothiazide-gout Olmesartan Amlodipine/HCTZ  Studies Reviewed: Marland Kitchen       EKG 12/16/22: Sinus rhythm.  Rate 74 bpm.    Risk Assessment/Calculations:     HYPERTENSION CONTROL Vitals:   05/27/23 0927 05/27/23 0959  BP: 124/79 (!) 154/90    The patient's blood pressure is elevated above target today.  In order to address the patient's elevated BP:           Physical Exam:    VS:  BP (!) 154/90   Pulse 67   Ht 5\' 5"  (1.651 m)   Wt 257 lb (116.6 kg)    SpO2 95%   BMI 42.77 kg/m  , BMI Body mass index is 42.77 kg/m. GENERAL:  Well appearing HEENT: Pupils equal round and reactive, fundi not visualized, oral mucosa unremarkable NECK:  No jugular venous distention, waveform within normal limits, carotid upstroke brisk and symmetric, no bruits, no thyromegaly LUNGS:  Clear to auscultation bilaterally HEART:  RRR.  PMI not displaced or sustained,S1 and S2 within normal limits, no S3, no S4, no clicks, no rubs, no murmurs ABD:  Flat, positive bowel sounds normal in frequency in pitch, no bruits, no rebound, no guarding, no midline pulsatile mass, no hepatomegaly, no splenomegaly EXT:  2 plus pulses throughout, no edema, no cyanosis no clubbing SKIN:  No rashes no nodules NEURO:  Cranial nerves II through XII grossly intact, motor grossly intact throughout PSYCH:  Cognitively intact, oriented to person place and time  Wt Readings from Last 3 Encounters:  05/27/23 257 lb (116.6 kg)  12/25/22 247 lb 8 oz (112.3 kg)  12/16/22 250 lb (113.4 kg)     ASSESSMENT AND PLAN: .       # Resistant Hypertension Despite being on multiple antihypertensive medications, blood pressure remains uncontrolled. Possible underlying causes need to be investigated. -Order labs including renin, aldosterone, catecholamines, metanephrines, cortisol, and TSH. -Order renal Dopplers to assess for renal artery stenosis. -Consider switching from propranolol to carvedilol 12.5mg  BID after heart monitor results are reviewed.  # Palpitations Frequent episodes of palpitations, possibly related to stress or an underlying hormonal issue. -Wear a heart monitor for 7 days to capture any abnormal heart rhythms. -Press a button on the monitor when palpitations are felt to correlate symptoms with heart rhythm.  # Sleep Disturbance Reports only 2-3 hours of sleep per night, which could be contributing to hypertension and fatigue. -Consider discussing sleep aids with primary care  provider. -Implement stress management techniques such as exercise and meditation.  # Pruritus Reports itching of hands and feet, possibly related to amitriptyline. -Discuss with prescribing provider about possible alternatives.  Follow-up in 4-6 weeks to review results of investigations and assess response to any changes in treatment.       Signed, Rebecca Si, MD

## 2023-05-27 NOTE — Patient Instructions (Addendum)
Medication Instructions:  WHEN YOU FINISH THE MONITOR STOP THE PROPRANOLOL/ AND START THE CARVEDILOL 12.5 MG TWICE A DAY   Labwork: RENIN/ALDOSTERONE/CATACHOLAMINES/METANEPHRINES/TSH/CORTISOL TODAY    Testing/Procedures: Your physician has requested that you have a renal artery duplex. During this test, an ultrasound is used to evaluate blood flow to the kidneys. Allow one hour for this exam. Do not eat after midnight the day before and avoid carbonated beverages. Take your medications as you usually do.  7 DAY ZIO   Follow-Up: 4 TO 6 WEEKS WITH CAITLIN W NP OR DR Quinlan   If you need a refill on your cardiac medications before your next appointment, please call your pharmacy.  ZIO XT- Long Term Monitor Instructions  Your physician has requested you wear a ZIO patch monitor for 7 days.  This is a single patch monitor. [employer] supplies one patch monitor per enrollment. Additional stickers are not available. Please do not apply patch if you will be having a Nuclear Stress Test,  Echocardiogram, Cardiac CT, MRI, or Chest Xray during the period you would be wearing the  monitor. The patch cannot be worn during these tests. You cannot remove and re-apply the  ZIO XT patch monitor.  Your ZIO patch monitor will be mailed 3 day USPS to your address on file. It may take 3-5 days  to receive your monitor after you have been enrolled.  Once you have received your monitor, please review the enclosed instructions. Your monitor  has already been registered assigning a specific monitor serial # to you.  Billing and Patient Assistance Program Information  We have supplied [employer] with any of your insurance information on file for billing purposes. [employer] offers a sliding scale Patient Assistance Program for patients that do not have  insurance, or whose insurance does not completely cover the cost of the ZIO monitor.  You must apply for the Patient Assistance Program to qualify for this  discounted rate.  To apply, please call [employer] at [phone], select option 4, select option 2, ask to apply for  Patient Assistance Program. Meredeth Ide will ask your household income, and how many people  are in your household. They will quote your out-of-pocket cost based on that information.  [employer] will also be able to set up a 64-month, interest-free payment plan if needed.  Applying the monitor   Shave hair from upper left chest.  Hold abrader disc by orange tab. Rub abrader in 40 strokes over the upper left chest as  indicated in your monitor instructions.  Clean area with 4 enclosed alcohol pads. Let dry.  Apply patch as indicated in monitor instructions. Patch will be placed under collarbone on left  side of chest with arrow pointing upward.  Rub patch adhesive wings for 2 minutes. Remove white label marked "1". Remove the white  label marked "2". Rub patch adhesive wings for 2 additional minutes.  While looking in a mirror, press and release button in center of patch. A small green light will  flash 3-4 times. This will be your only indicator that the monitor has been turned on.  Do not shower for the first 24 hours. You may shower after the first 24 hours.  Press the button if you feel a symptom. You will hear a small click. Record Date, Time and  Symptom in the Patient Logbook.  When you are ready to remove the patch, follow instructions on the last 2 pages of Patient  Logbook. Stick patch monitor onto the last page of  Patient Logbook.  Place Patient Logbook in the blue and white box. Use locking tab on box and tape box closed  securely. The blue and white box has prepaid postage on it. Please place it in the mailbox as  soon as possible. Your physician should have your test results approximately 7 days after the  monitor has been mailed back to [facility].  Call [facility] Customer Care at [phone] if you have questions regarding  your ZIO XT patch monitor. Call  them immediately if you see an orange light blinking on your  monitor.  If your monitor falls off in less than 4 days, contact our Monitor department at [phone].  If your monitor becomes loose or falls off after 4 days call [employer] at [phone] for  suggestions on securing your monitor

## 2023-06-08 LAB — CATECHOLAMINES, FRACTIONATED, PLASMA
Dopamine: <30 pg/mL (ref 0–48)
Epinephrine: <15 pg/mL (ref 0–62)
Norepinephrine: 673 pg/mL (ref 0–874)

## 2023-06-08 LAB — ALDOSTERONE + RENIN ACTIVITY W/ RATIO
Aldos/Renin Ratio: 1.1 (ref 0.0–30.0)
Aldosterone: 2.9 ng/dL (ref 0.0–30.0)
Renin Activity, Plasma: 2.604 ng/mL/h (ref 0.167–5.380)

## 2023-06-08 LAB — METANEPHRINES, PLASMA
Metanephrine, Free: <25 pg/mL (ref 0.0–88.0)
Normetanephrine, Free: 90.9 pg/mL (ref 0.0–210.1)

## 2023-06-08 LAB — CORTISOL: Cortisol: 7.2 ug/dL (ref 6.2–19.4)

## 2023-06-08 LAB — TSH: TSH: 1.49 u[IU]/mL (ref 0.450–4.500)

## 2023-06-13 ENCOUNTER — Other Ambulatory Visit (INDEPENDENT_AMBULATORY_CARE_PROVIDER_SITE_OTHER): Payer: Self-pay

## 2023-06-17 ENCOUNTER — Ambulatory Visit (HOSPITAL_BASED_OUTPATIENT_CLINIC_OR_DEPARTMENT_OTHER): Payer: 59

## 2023-06-17 DIAGNOSIS — I10 Essential (primary) hypertension: Secondary | ICD-10-CM | POA: Diagnosis not present

## 2023-06-24 ENCOUNTER — Telehealth: Payer: Self-pay

## 2023-06-24 DIAGNOSIS — Z0000 Encounter for general adult medical examination without abnormal findings: Secondary | ICD-10-CM

## 2023-06-24 NOTE — Telephone Encounter (Signed)
 Called patient per upcoming visit on 06/29/23 with Dr. Duke Salvia regarding returning Vivify device. Patient verbalized understanding and stated that she will bring it to her appointment.    Renaee Munda, MS, ERHD, [facility]  Care Guide, Health & Wellness Coach [address], [address] [place] [address] Telephone: [phone] Email: [email or web address]

## 2023-06-29 ENCOUNTER — Ambulatory Visit (HOSPITAL_BASED_OUTPATIENT_CLINIC_OR_DEPARTMENT_OTHER): Payer: 59 | Admitting: Cardiovascular Disease

## 2023-06-29 VITALS — BP 125/74 | HR 81 | Ht 65.0 in | Wt 259.1 lb

## 2023-06-29 DIAGNOSIS — I10 Essential (primary) hypertension: Secondary | ICD-10-CM | POA: Diagnosis not present

## 2023-06-29 DIAGNOSIS — I1A0 Resistant hypertension: Secondary | ICD-10-CM | POA: Diagnosis not present

## 2023-06-29 DIAGNOSIS — Z5181 Encounter for therapeutic drug level monitoring: Secondary | ICD-10-CM | POA: Diagnosis not present

## 2023-06-29 MED ORDER — TELMISARTAN 80 MG PO TABS: 80.0000 mg | ORAL_TABLET | Freq: Every day | ORAL | 3 refills | Status: AC

## 2023-06-29 NOTE — Progress Notes (Signed)
 Cardiology Office Note:  .    Date:  07/16/2023  ID:  Rebecca Fry, DOB [birth year], MRN [id number] PCP: Rebecca Rakers, MD  [facility] Providers Cardiologist:  Rebecca Si, MD     History of Present Illness: .    Rebecca Fry is a 38 y.o. female with hypertension, prediabetes, anxiety, and gout, here for follow up.  She was first seen 12/2022 for evaluation of shortness of breath and palpitations. She saw her PCP, Dr. Parke Fry, 08/2022 and noted the symptoms. EKG at the time showed sinus rhythm with no arrhythmias. Labs were notable for hypercholesterolemia and normal renal function and electrolytes. Hemoglobin A1c was 5.9%.   She was seen by vascular surgery 10/2022 given RLE chronic venous insufficiency with varicose veins. She had presented to urgent care in 08/2022 due to leg pain which she felt was related. LE duplex was negative for DVT. They discussed ablation but it was recommended to treat conservatively.  She ntoed intermittent CP and arm numbness.  She was referred for coronary CT-a 12/2022 that revealed a calcium score of 0 and a plaque volume of 42.  Spironolactone was added to her medication regimen.  She was enrolled in our remote patient monitoring study.  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  Spironolactone was increased to 50 mg.  Recent monitoring in the vivify system has been in the 130s to 160s over 70s to 80s.  Averaging in the 140s.  At her appointment 05/2023 She reported atypical chest discomfort.  BP was ranging 130-160s.  It was controlled in the office.  Renal Dopplers were inconclusive due to poor visualization.   Rebecca Fry experiences daily episodes of chest pain, typically lasting about two minutes, primarily when lying down or at rest rather than during exertion. No associated symptoms such as presyncope are noted. She describes the sensation as one where she 'can't move' during the episode but feels  okay once it eases. The episodes were more frequent immediately after returning her heart monitor, which showed no abnormalities.  She engages in daily exercise, walking for about thirty minutes each day during lunch, without experiencing chest pain during these activities. Her blood pressure has been fluctuating, with some days being high and others not. She uses a home blood pressure cuff, which shows similar readings to the clinic's device.  She was previously on telmisartan hydrochlorothiazide but stopped due to itching, attributed to the hydrochlorothiazide component. Since discontinuing it, she has not experienced itching. She continues to take amlodipine 5 mg twice daily, carvedilol 12.5 mg twice daily, and spironolactone 50 mg daily. She has been on simvastatin since September or October for cholesterol management.  She mentions a recent finding of a lump in her right breast, confirmed by both her general doctor and gynecologist, and is scheduled for a mammogram and ultrasound. She does not believe this is related to her cardiac issues but wanted to inform her cardiologist.      ROS:  Please see the history of present illness. All other systems are reviewed and negative.  (+) Intermittent chest pain (+) Shortness of breath (+) Diaphoresis  Prior antihypertensives;  Telmisartan Hydrochlorothiazide-gout Olmesartan Amlodipine/HCTZ  Studies Reviewed: Marland Kitchen   EKG Interpretation Date/Time:  Monday June 29 2023 15:03:59 EDT Ventricular Rate:  82 PR Interval:  162 QRS Duration:  98 QT Interval:  384 QTC Calculation: 448 R Axis:   67  Text Interpretation: Normal sinus rhythm Nonspecific  T wave abnormality When compared with ECG of 16-Dec-2022 10:05, Nonspecific T wave abnormality now evident in Anterolateral leads Confirmed by Rebecca Fry ([id number]) on 06/29/2023 3:17:33 PM   EKG 12/16/22: Sinus rhythm.  Rate 74 bpm.    12 Day Zio Monitor 06/12/23:   Quality: Fair.  Baseline  artifact. Predominant rhythm:  Average heart rate: 84 bpm Max heart rate: 123 bpm Min heart rate: 84 bpm Pauses >2.5 seconds: none   Rare PACs and PVCs.   Coronary CT-A 12/2022:   IMPRESSION: 1. Coronary calcium score of 0. This was 0 percentile for age-, sex, and race-matched controls.  2. Total plaque volume 42 mm3 which is 90 percentile for age- and sex-matched controls (calcified plaque 0 mm3; non-calcified plaque 42 mm3). TPV is (mild).  3. Normal coronary origin with right dominance.  4. Normal coronary arteries.   RECOMMENDATIONS: CAD-RADS 0: No evidence of CAD (0%). Consider non-atherosclerotic causes of chest pain.  Risk Assessment/Calculations:      Physical Exam:    VS:  BP 125/74 (BP Location: Left Arm, Patient Position: Sitting, Cuff Size: Large)   Pulse 81   Ht 5\' 5"  (1.651 m)   Wt 259 lb 1.6 oz (117.5 kg)   SpO2 98%   BMI 43.12 kg/m  , BMI Body mass index is 43.12 kg/m. GENERAL:  Well appearing HEENT: Pupils equal round and reactive, fundi not visualized, oral mucosa unremarkable NECK:  No jugular venous distention, waveform within normal limits, carotid upstroke brisk and symmetric, no bruits, no thyromegaly LUNGS:  Clear to auscultation bilaterally HEART:  RRR.  PMI not displaced or sustained,S1 and S2 within normal limits, no S3, no S4, no clicks, no rubs, no murmurs ABD:  Flat, positive bowel sounds normal in frequency in pitch, no bruits, no rebound, no guarding, no midline pulsatile mass, no hepatomegaly, no splenomegaly EXT:  2 plus pulses throughout, no edema, no cyanosis no clubbing SKIN:  No rashes no nodules NEURO:  Cranial nerves II through XII grossly intact, motor grossly intact throughout PSYCH:  Cognitively intact, oriented to person place and time  Wt Readings from Last 3 Encounters:  06/29/23 259 lb 1.6 oz (117.5 kg)  05/27/23 257 lb (116.6 kg)  12/25/22 247 lb 8 oz (112.3 kg)     ASSESSMENT AND PLAN: .    Assessment and Plan     # Intermittent chest pain Intermittent chest pain at rest, not linked to exertion or syncope. Heart monitor showed no abnormalities. Low likelihood of significant coronary artery disease. - Recommend CardiaMobile for real-time heart rhythm monitoring. - Encourage continued physical activity.  # Hypertension Blood pressure fluctuates, previously on telmisartan hydrochlorothiazide but discontinued due to itching. Current medications include amlodipine, carvedilol, and spironolactone. Plan to reintroduce telmisartan without hydrochlorothiazide. CT scan needed to evaluate renal arteries due to control difficulty and inadequate ultrasound imaging. - Reintroduce telmisartan without hydrochlorothiazide. - Monitor blood pressure at home and report significant changes. - Order CT scan of the abdomen to evaluate renal arteries.  # Renal ultrasound findings Inadequate imaging likely due to body habitus. Need to rule out renal artery stenosis as a cause of hypertension. - Order CT scan of the abdomen to assess renal arteries and rule out blockages.  # Hyperlipidemia On simvastatin since September or October. Soft plaque in coronary arteries necessitates cholesterol management. Goal LDL < 70 mg/dL to stabilize plaque. - Request recent cholesterol levels from primary care provider. - Order fasting lipid panel to assess current cholesterol levels. - Continue simvastatin  if LDL < 70 mg/dL.       Signed, Rebecca Si, MD

## 2023-06-29 NOTE — Patient Instructions (Addendum)
 Medication Instructions:  START TELMISARTAN 80 MG DAILY   Labwork: FASTING LP/CMET IN 1 WEEK   Testing/Procedures: Non-Cardiac CT Angiography (CTA), is a special type of CT scan that uses a computer to produce multi-dimensional views of major blood vessels throughout the body. In CT angiography, a contrast material is injected through an IV to help visualize the blood vessels OF ABDOMEN   Follow-Up: 3 MONTHS WITH CAITLIN W NP OR DR [facility]   Any Other Special Instructions Will Be Listed Below (If Applicable).  Consider getting a Kardia mobile device to catch your palpitations.    If you need a refill on your cardiac medications before your next appointment, please call your pharmacy.

## 2023-07-05 ENCOUNTER — Ambulatory Visit (HOSPITAL_BASED_OUTPATIENT_CLINIC_OR_DEPARTMENT_OTHER)
Admission: RE | Admit: 2023-07-05 | Discharge: 2023-07-05 | Disposition: A | Discharge status: Home / Self Care | Source: Ambulatory Visit | Attending: Cardiovascular Disease | Admitting: Cardiovascular Disease

## 2023-07-05 DIAGNOSIS — I1A0 Resistant hypertension: Secondary | ICD-10-CM | POA: Insufficient documentation

## 2023-07-05 MED ORDER — IOHEXOL 350 MG/ML SOLN
100.0000 mL | Freq: Once | INTRAVENOUS | Status: AC | PRN
  Administered 2023-07-05: 100 mL via INTRAVENOUS

## 2023-07-07 ENCOUNTER — Encounter (HOSPITAL_BASED_OUTPATIENT_CLINIC_OR_DEPARTMENT_OTHER): Payer: Self-pay

## 2023-07-07 ENCOUNTER — Encounter (HOSPITAL_BASED_OUTPATIENT_CLINIC_OR_DEPARTMENT_OTHER): Payer: Self-pay | Admitting: Cardiovascular Disease

## 2023-07-07 DIAGNOSIS — R160 Hepatomegaly, not elsewhere classified: Secondary | ICD-10-CM

## 2023-07-08 ENCOUNTER — Encounter (HOSPITAL_BASED_OUTPATIENT_CLINIC_OR_DEPARTMENT_OTHER): Payer: 59 | Admitting: Cardiovascular Disease

## 2023-07-16 ENCOUNTER — Encounter (HOSPITAL_BASED_OUTPATIENT_CLINIC_OR_DEPARTMENT_OTHER): Payer: Self-pay | Admitting: Cardiovascular Disease

## 2023-07-17 ENCOUNTER — Ambulatory Visit (HOSPITAL_COMMUNITY)
Admission: RE | Admit: 2023-07-17 | Discharge: 2023-07-17 | Disposition: A | Discharge status: Home / Self Care | Source: Ambulatory Visit | Attending: Cardiovascular Disease | Admitting: Cardiovascular Disease

## 2023-07-17 ENCOUNTER — Other Ambulatory Visit (HOSPITAL_COMMUNITY): Payer: Self-pay | Admitting: Cardiovascular Disease

## 2023-07-17 DIAGNOSIS — R160 Hepatomegaly, not elsewhere classified: Secondary | ICD-10-CM | POA: Diagnosis present

## 2023-07-17 MED ORDER — GADOBUTROL 1 MMOL/ML IV SOLN
10.0000 mL | Freq: Once | INTRAVENOUS | Status: AC | PRN
  Administered 2023-07-17: 10 mL via INTRAVENOUS

## 2023-07-18 ENCOUNTER — Ambulatory Visit (HOSPITAL_COMMUNITY)

## 2023-07-20 ENCOUNTER — Encounter (HOSPITAL_BASED_OUTPATIENT_CLINIC_OR_DEPARTMENT_OTHER): Payer: Self-pay

## 2023-07-20 DIAGNOSIS — R160 Hepatomegaly, not elsewhere classified: Secondary | ICD-10-CM

## 2023-07-20 DIAGNOSIS — K760 Fatty (change of) liver, not elsewhere classified: Secondary | ICD-10-CM

## 2023-07-27 ENCOUNTER — Encounter: Payer: Self-pay | Admitting: Physician Assistant

## 2023-08-07 ENCOUNTER — Ambulatory Visit: Payer: Self-pay | Admitting: Surgery

## 2023-09-22 NOTE — Progress Notes (Unsigned)
 09/23/2023 Rebecca Fry [id number] 1985/08/11  Referring provider: Jonathon Neighbors, MD Primary GI doctor: Dr. 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  Maternal Aunt with history of cirrhosis was alcoholic, another maternal aunt with liver failure unknown if ETOH played a part No significant ETOH, no drug use, NSAIDS PRN, no supplements No jaundice, some swelling bilateral legs, no AB swelling Must exclude other chronic causes of hepatocellular inflammation that can mimic fatty liver on ultrasound     Latest Ref Rng & Units 03/07/2014   10:45 PM 02/01/2013    8:22 PM 05/18/2008    1:59 PM  Hepatic Function  Total Protein 6.0 - 8.3 g/dL 8.3  8.3  7.0   Albumin 3.5 - 5.2 g/dL 4.2  4.2  3.6   AST 0 - 37 U/L 14  16  16    ALT 0 - 35 U/L 16  16  20    Alk Phosphatase 39 - 117 U/L 56  62  74   Total Bilirubin 0.3 - 1.2 mg/dL <4.0  0.2  0.5    - Labs to include: hepatitis panel, iron, ferritin, TIBC,  IgG, ANA, Antismooth muscle antibody, AMA, celiac, thyroid, alpha-one-antitrypsin level - need LFTs and CBC monitored every 6 months, - revaluation with imaging every 2-3 years.  -Continue to work on risk factor modification including diet exercise and control of risk factors including blood sugars.  Liver lesions MRCP 07/17/2023 Multiple enhancing hepatic lesions, as above. Multiple benign hepatic adenomas are favored over benign FNH, although both remain possible. In the absence of known primary malignancy, metastatic disease is unlikely. Follow-up Eovist MRI abdomen with/without contrast is suggested in 3 months. Severe hepatic steatosis with areas of focal fatty sparing.Riedel's lobe configuration with enlarged inferior right hepaticlobe. - Eovist MRI abdomen with/without contrast 10/16/2023  family history of multiple polyps  at young age No change in bowel habits but has BM 3-4 x in the morning, and 2-3 x when he gets home, no hematochezia, normal for the family Father with multiple polyps and in that family, dad with colon polyps in his 20's, gets colonoscopies twice a year, brother with polyps age 24 and other brother with polyps age 37, both get colonoscopies every 3-5 years.  No genetic testing Discussed with the patient, with significant family history of significant polyps at young ages will plan on colonoscopy at [facility] this year to evaluate We have discussed the risks of bleeding, infection, perforation, medication reactions, and remote risk of death associated with colonoscopy. All questions were answered and the patient acknowledges these risk and wishes to proceed.  Morbid obesity  Body mass index is 40.9 kg/m.  -Patient has been advised to make an attempt to improve diet and exercise patterns to aid in weight loss. 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 female with a past medical history listed below presents for evaluation of fatty liver.   Discussed the use of AI scribe software for clinical note transcription with the patient, who gave verbal consent to proceed.  History of Present Illness  Rebecca Fry is a 39 year old female who presents for evaluation and follow-up of fatty liver and liver lesions. She was referred by a heart doctor for evaluation of fatty liver and liver lesions discovered during a CT angiogram.  She was initially undergoing a CT angiogram to rule out renal artery stenosis due to abnormal heartbeats and resistant high blood pressure.  The CT did not show renal artery stenosis but revealed advanced fatty liver and lesions on the liver. An MRCP was performed, showing multiple benign-appearing adenomas. She is scheduled for a follow-up MRI with Eovist dye next month.  She experiences occasional abdominal swelling and pain, particularly in the right lower abdomen. No jaundice. Normal bowel movements without dark or bloody stools, but frequent bowel movements, up to seven to eight times a day, which she considers abnormal. Her siblings share this trait, and her father has a history of colon polyps.  There is a family history of liver and kidney issues, with all grandparents on dialysis for kidney and liver failure. There is a history of undiagnosed cirrhosis attributed to alcoholism on her maternal side. Her father and uncle have a history of colon polyps, with her father having polyps in his thirties. Her brothers have also started early colonoscopy screenings, with one brother having polyps.  She denies personal history of alcohol use, stating she stopped drinking recreationally years ago. She occasionally uses Aleve but prefers to 'tough it out'. No history of drug use. Occasional chest pain monitored by her heart doctor and rare heartburn that can wake her at night. No significant weight loss despite efforts to lose weight, noting a recent decrease from 252 to 247 pounds.      She  reports that she has never smoked. She has never used smokeless tobacco. She reports that she does not drink alcohol and does not use drugs.  RELEVANT GI HISTORY, IMAGING AND LABS: Results   RADIOLOGY CT Angiography: No renal artery stenosis; advanced hepatic steatosis; possible fat-sparing areas (April 2025) MRCP: Multiple benign-appearing adenomas (April 2025) MRI Abdomen: Diverticulosis (April 2025)      CBC    Component Value Date/Time   WBC 7.0 01/30/2016 2323   RBC 4.86 01/30/2016 2323   HGB 13.1 01/30/2016 2323   HCT 39.4 01/30/2016 2323    PLT 398 01/30/2016 2323   MCV 81.1 01/30/2016 2323   MCH 27.0 01/30/2016 2323   MCHC 33.2 01/30/2016 2323   RDW 13.8 01/30/2016 2323   LYMPHSABS 4.1 (H) 03/07/2014 2245   MONOABS 0.5 03/07/2014 2245   EOSABS 0.3 03/07/2014 2245   BASOSABS 0.0 03/07/2014 2245   No results for input(s): HGB in the last 8760 hours.  CMP     Component Value Date/Time   NA 137 12/25/2022 1021   K 4.6 12/25/2022 1021   CL 100 12/25/2022 1021   CO2 22 12/25/2022 1021   GLUCOSE 85 12/25/2022 1021   GLUCOSE 111 (H) 01/30/2016 2323   BUN 9 12/25/2022 1021   CREATININE 0.71 12/25/2022 1021   CALCIUM 9.6 12/25/2022 1021   PROT 8.3 03/07/2014 2245   ALBUMIN 4.2 03/07/2014 2245   AST 14 03/07/2014 2245   ALT 16 03/07/2014 2245   ALKPHOS 56 03/07/2014 2245   BILITOT <0.2 (L) 03/07/2014 2245   GFRNONAA >60 01/30/2016 2323   GFRAA >60 01/30/2016 2323      Latest Ref Rng & Units 03/07/2014   10:45 PM 02/01/2013    8:22 PM 05/18/2008    1:59 PM  Hepatic Function  Total Protein 6.0 - 8.3 g/dL 8.3  8.3  7.0   Albumin 3.5 - 5.2 g/dL 4.2  4.2  3.6   AST 0 - 37 U/L 14  16  16    ALT 0 - 35 U/L 16  16  20    Alk Phosphatase 39 - 117 U/L 56  62  74   Total Bilirubin 0.3 - 1.2 mg/dL <4.0  0.2  0.5       Current Medications:    Current Outpatient Medications (Cardiovascular):    simvastatin (ZOCOR) 10 MG tablet, Take 10 mg by mouth at bedtime.   spironolactone  (ALDACTONE ) 50 MG tablet, Take 1 tablet (50 mg total) by mouth daily.   telmisartan  (MICARDIS ) 80 MG tablet, Take 1 tablet (80 mg total) by mouth daily.   carvedilol  (COREG ) 12.5 MG tablet, Take 1 tablet (12.5 mg total) by mouth 2 (two) times daily.  Current Outpatient Medications (Respiratory):    albuterol  (VENTOLIN  HFA) 108 (90 Base) MCG/ACT inhaler, TAKE 2 PUFFS EVERY 4-6 HOURSS AS NEEDED   BREZTRI AEROSPHERE 160-9-4.8 MCG/ACT AERO, Inhale into the lungs.  Current Outpatient Medications (Analgesics):    acetaminophen  (TYLENOL ) 325 MG  tablet, Take 2 tablets (650 mg total) by mouth every 6 (six) hours as needed for moderate pain.   allopurinol (ZYLOPRIM) 100 MG tablet, Take 100 mg by mouth daily.   colchicine 0.6 MG tablet, Take 0.6 mg by mouth daily as needed.   Current Outpatient Medications (Other):    ALPRAZolam (XANAX) 0.5 MG tablet, Take 0.5 mg by mouth at bedtime as needed for anxiety.   amitriptyline (ELAVIL) 25 MG tablet, Take 25 mg by mouth at bedtime.  Medical History:  Past Medical History:  Diagnosis Date   Abnormal Pap smear    colpo   Amenorrhea    Anemia    Anxiety    Bladder infection    BV (bacterial vaginosis)    Chest pain of uncertain etiology 12/16/2022   Chlamydia    Gout    History of chicken pox    Hypertension    Migraine    otc med prn   Ovarian cyst    Palpitations 05/27/2023   PONV (postoperative nausea and vomiting)    after surgery in 2009-nausea & vomiting   Resistant hypertension 03/15/2012   SAB (spontaneous abortion)    SOB (shortness of breath)    SVD (spontaneous vaginal delivery)    x 3   Yeast infection    Allergies:  Allergies  Allergen Reactions   Hydrocodone  Itching     Surgical History:  She  has a past surgical history that includes Tonsilectomy, adenoidectomy, bilateral myringotomy and tubes; lipomas removal (2009); Dilation and curettage of uterus (2007); Hysteroscopy with D & C (04/05/2012); Wisdom tooth extraction (2015); and laparoscopy (N/A, 10/31/2014). Family History:  Her family history includes Anemia in her mother; Diabetes in her father, maternal aunt, and paternal uncle; Heart attack in her paternal grandfather and paternal grandmother; Heart disease in her father and paternal grandfather; Hypertension in her father, maternal grandfather, maternal grandmother, mother, paternal grandfather, and paternal grandmother; Irritable bowel syndrome in her paternal grandmother; Stroke in her cousin and maternal aunt; Thyroid disease in her paternal  grandmother.  REVIEW OF SYSTEMS  : All other systems reviewed and negative except where noted in the History of Present Illness.  PHYSICAL EXAM: BP 122/82   Pulse 84   Ht 5' 5 (1.651 m)   Wt 245 lb 12.8 oz (111.5  kg)   BMI 40.90 kg/m  Physical Exam   MEASUREMENTS: Weight- 247. GENERAL APPEARANCE: Well nourished, in no apparent distress. HEENT: No cervical lymphadenopathy, unremarkable thyroid, sclerae anicteric, conjunctiva pink. RESPIRATORY: Respiratory effort normal, breath sounds equal bilaterally without rales, rhonchi, or wheezing. Lungs normal to auscultation. CARDIO: Regular rate and rhythm with no murmurs, rubs, or gallops, peripheral pulses intact. ABDOMEN: Soft, non-distended, active bowel sounds in all four quadrants, epigastric and right lower abdominal tenderness on palpation, no rebound, no mass appreciated. RECTAL: Declines. MUSCULOSKELETAL: Full range of motion, normal gait, without edema. SKIN: Dry, intact without rashes or lesions. No jaundice. NEURO: Alert, oriented, no focal deficits. PSYCH: Cooperative, normal mood and affect.      Edmonia Gottron, PA-C 11:37 AM

## 2023-09-23 ENCOUNTER — Encounter: Payer: Self-pay | Admitting: Physician Assistant

## 2023-09-23 ENCOUNTER — Ambulatory Visit: Admitting: Physician Assistant

## 2023-09-23 ENCOUNTER — Encounter: Payer: Self-pay | Admitting: Internal Medicine

## 2023-09-23 ENCOUNTER — Other Ambulatory Visit

## 2023-09-23 VITALS — BP 122/82 | HR 84 | Ht 65.0 in | Wt 245.8 lb

## 2023-09-23 DIAGNOSIS — K760 Fatty (change of) liver, not elsewhere classified: Secondary | ICD-10-CM

## 2023-09-23 DIAGNOSIS — K769 Liver disease, unspecified: Secondary | ICD-10-CM

## 2023-09-23 DIAGNOSIS — I1A0 Resistant hypertension: Secondary | ICD-10-CM

## 2023-09-23 DIAGNOSIS — K5730 Diverticulosis of large intestine without perforation or abscess without bleeding: Secondary | ICD-10-CM

## 2023-09-23 DIAGNOSIS — Z8371 Family history of adenomatous and serrated polyps: Secondary | ICD-10-CM

## 2023-09-23 DIAGNOSIS — Z6841 Body Mass Index (BMI) 40.0 and over, adult: Secondary | ICD-10-CM

## 2023-09-23 DIAGNOSIS — R194 Change in bowel habit: Secondary | ICD-10-CM

## 2023-09-23 DIAGNOSIS — I10 Essential (primary) hypertension: Secondary | ICD-10-CM | POA: Diagnosis not present

## 2023-09-23 DIAGNOSIS — Z83719 Family history of colon polyps, unspecified: Secondary | ICD-10-CM

## 2023-09-23 NOTE — Progress Notes (Signed)
 I agree with the assessment and plan as outlined by Ms. Steffanie Dunn.

## 2023-09-23 NOTE — Patient Instructions (Signed)
 _______________________________________________________  If your blood pressure at your visit was 140/90 or greater, please contact your primary care physician to follow up on this.  _______________________________________________________  If you are age 40 or older, your body mass index should be between 23-30. Your Body mass index is 40.9 kg/m. If this is out of the aforementioned range listed, please consider follow up with your Primary Care Provider.  If you are age 34 or younger, your body mass index should be between 19-25. Your Body mass index is 40.9 kg/m. If this is out of the aformentioned range listed, please consider follow up with your Primary Care Provider.   ________________________________________________________  The [facility] providers would like to encourage you to use MYCHART to communicate with providers for non-urgent requests or questions.  Due to long hold times on the telephone, sending your provider a message by [facility] may be a faster and more efficient way to get a response.  Please allow 48 business hours for a response.  Please remember that this is for non-urgent requests.  _______________________________________________________  Your provider has requested that you go to the basement level for lab work before leaving today. Press B on the elevator. The lab is located at the first door on the left as you exit the elevator.  You have been scheduled for an MRI at [facility] in [place] on 10-26-23. Your appointment time is 9am. Please arrive to 15 minutes prior to your appointment time for registration purposes. Please make certain not to have anything to eat or drink 4 hours prior to your test. In addition, if you have any metal in your body, have a pacemaker or defibrillator, please be sure to let your ordering physician know. This test typically takes 45 minutes to 1 hour to complete. Should you need to reschedule, please call [phone] to do  so.   You have been scheduled for a colonoscopy. Please follow written instructions given to you at your visit today.   If you use inhalers (even only as needed), please bring them with you on the day of your procedure.  DO NOT TAKE 7 DAYS PRIOR TO TEST- Trulicity (dulaglutide) Ozempic , Wegovy  (semaglutide ) Mounjaro (tirzepatide) Bydureon Bcise (exanatide extended release)  DO NOT TAKE 1 DAY PRIOR TO YOUR TEST Rybelsus  (semaglutide ) Adlyxin (lixisenatide) Victoza (liraglutide) Byetta (exanatide) ___________________________________________________________________________  Due to recent changes in healthcare laws, you may see the results of your imaging and laboratory studies on MyChart before your provider has had a chance to review them.  We understand that in some cases there may be results that are confusing or concerning to you. Not all laboratory results come back in the same time frame and the provider may be waiting for multiple results in order to interpret others.  Please give us  48 hours in order for your provider to thoroughly review all the results before contacting the office for clarification of your results.    FIBER SUPPLEMENT You can do metamucil or fibercon once or twice a day but if this causes gas/bloating please switch to Benefiber or Citracel.  Fiber is good for constipation/diarrhea/irritable bowel syndrome.  It can also help with weight loss and can help lower your bad cholesterol (LDL).  Please do 1 TBSP in the morning in water, coffee, or tea.  It can take up to a month before you can see a difference with your bowel movements.  It is cheapest from [employer], [employer], [employer].    Diverticulosis Diverticulosis is a condition that develops when  small pouches (diverticula) form in the wall of the large intestine (colon). The colon is where water is absorbed and stool (feces) is formed. The pouches form when the inside layer of the colon pushes through weak spots in  the outer layers of the colon. You may have a few pouches or many of them. The pouches usually do not cause problems unless they become inflamed or infected. When this happens, the condition is called diverticulitis- this is left lower quadrant pain, diarrhea, fever, chills, nausea or vomiting.  If this occurs please call the office or go to the hospital. Sometimes these patches without inflammation can also have painless bleeding associated with them, if this happens please call the office or go to the hospital. Preventing constipation and increasing fiber can help reduce diverticula and prevent complications. Even if you feel you have a high-fiber diet, suggest getting on Benefiber or Cirtracel 2 times daily.   Metabolic dysfunction associated seatohepatitis  Now the leading cause of liver failure in the [place] .  It is normally from such risk factors as obesity, diabetes, insulin resistance, high cholesterol, or metabolic syndrome.  The only definitive therapy is weight loss and exercise.   Suggest walking 20-30 mins daily.  Decreasing carbohydrates, increasing veggies.    Fatty Liver Fatty liver is the accumulation of fat in liver cells. It is also called hepatosteatosis or steatohepatitis. It is normal for your liver to contain some fat. If fat is more than 5 to 10% of your liver's weight, you have fatty liver.  There are often no symptoms (problems) for years while damage is still occurring. People often learn about their fatty liver when they have medical tests for other reasons. Fat can damage your liver for years or even decades without causing problems. When it becomes severe, it can cause fatigue, weight loss, weakness, and confusion. This makes you more likely to develop more serious liver problems. The liver is the largest organ in the body. It does a lot of work and often gives no warning signs when it is sick until late in a disease. The liver has many important jobs  including: Breaking down foods. Storing vitamins, iron, and other minerals. Making proteins. Making bile for food digestion. Breaking down many products including medications, alcohol and some poisons.  PROGNOSIS  Fatty liver may cause no damage or it can lead to an inflammation of the liver. This is, called steatohepatitis.  Over time the liver may become scarred and hardened. This condition is called cirrhosis. Cirrhosis is serious and may lead to liver failure or cancer. NASH is one of the leading causes of cirrhosis. About 10-20% of Americans have fatty liver and a smaller 2-5% has NASH.  TREATMENT  Weight loss, fat restriction, and exercise in overweight patients produces inconsistent results but is worth trying. Good control of diabetes may reduce fatty liver. Eat a balanced, healthy diet. Increase your physical activity. There are no medical or surgical treatments for a fatty liver or NASH, but improving your diet and increasing your exercise may help prevent or reverse some of the damage.

## 2023-09-28 ENCOUNTER — Telehealth: Payer: Self-pay | Admitting: Physician Assistant

## 2023-09-28 MED ORDER — NA SULFATE-K SULFATE-MG SULF 17.5-3.13-1.6 GM/177ML PO SOLN: 1.0000 | ORAL | 0 refills | Status: DC

## 2023-09-28 NOTE — Telephone Encounter (Signed)
 Left message on voicemail that Suprep was sent to [facility] as requested.

## 2023-09-28 NOTE — Telephone Encounter (Signed)
 PT is calling to have Suprep sent to [facility] for colonoscopy on 6/18. Please advise.

## 2023-09-29 ENCOUNTER — Ambulatory Visit: Payer: Self-pay | Admitting: Physician Assistant

## 2023-09-29 LAB — GAMMA GT: GGT: 48 U/L (ref 3–50)

## 2023-09-29 LAB — HEPATITIS A ANTIBODY, TOTAL: Hepatitis A AB,Total: NONREACTIVE

## 2023-09-29 LAB — HEPATIC FUNCTION PANEL
AG Ratio: 1.6 (calc) (ref 1.0–2.5)
ALT: 22 U/L (ref 6–29)
AST: 18 U/L (ref 10–30)
Albumin: 4.7 g/dL (ref 3.6–5.1)
Alkaline phosphatase (APISO): 71 U/L (ref 31–125)
Bilirubin, Direct: 0.1 mg/dL (ref 0.0–0.2)
Globulin: 3 g/dL (ref 1.9–3.7)
Indirect Bilirubin: 0.3 mg/dL (ref 0.2–1.2)
Total Bilirubin: 0.4 mg/dL (ref 0.2–1.2)
Total Protein: 7.7 g/dL (ref 6.1–8.1)

## 2023-09-29 LAB — CBC WITH DIFFERENTIAL/PLATELET
Absolute Lymphocytes: 2992 {cells}/uL (ref 850–3900)
Absolute Monocytes: 422 {cells}/uL (ref 200–950)
Basophils Absolute: 48 {cells}/uL (ref 0–200)
Basophils Relative: 0.7 %
Eosinophils Absolute: 170 {cells}/uL (ref 15–500)
Eosinophils Relative: 2.5 %
HCT: 42.2 % (ref 35.0–45.0)
Hemoglobin: 13 g/dL (ref 11.7–15.5)
MCH: 26.1 pg — ABNORMAL LOW (ref 27.0–33.0)
MCHC: 30.8 g/dL — ABNORMAL LOW (ref 32.0–36.0)
MCV: 84.7 fL (ref 80.0–100.0)
MPV: 8.7 fL (ref 7.5–12.5)
Monocytes Relative: 6.2 %
Neutro Abs: 3169 {cells}/uL (ref 1500–7800)
Neutrophils Relative %: 46.6 %
Platelets: 472 10*3/uL — ABNORMAL HIGH (ref 140–400)
RBC: 4.98 10*6/uL (ref 3.80–5.10)
RDW: 13 % (ref 11.0–15.0)
Total Lymphocyte: 44 %
WBC: 6.8 10*3/uL (ref 3.8–10.8)

## 2023-09-29 LAB — IRON, TOTAL/TOTAL IRON BINDING CAP
%SAT: 19 % (ref 16–45)
Iron: 61 ug/dL (ref 40–190)
TIBC: 317 ug/dL (ref 250–450)

## 2023-09-29 LAB — MITOCHONDRIAL ANTIBODIES: Mitochondrial M2 Ab, IgG: <=20 U (ref <=20.0)

## 2023-09-29 LAB — ANTI-SMOOTH MUSCLE ANTIBODY, IGG: Actin (Smooth Muscle) Antibody (IGG): <20 U (ref <20)

## 2023-09-29 LAB — CERULOPLASMIN: Ceruloplasmin: 40 mg/dL (ref 14–48)

## 2023-09-29 LAB — ANTI-NUCLEAR AB-TITER (ANA TITER): ANA Titer 1: 1:80 {titer} — ABNORMAL HIGH

## 2023-09-29 LAB — HEPATITIS C ANTIBODY: Hepatitis C Ab: NONREACTIVE

## 2023-09-29 LAB — HEPATITIS B SURFACE ANTIBODY,QUALITATIVE: Hep B S Ab: NONREACTIVE

## 2023-09-29 LAB — ANA: Anti Nuclear Antibody (ANA): POSITIVE — AB

## 2023-09-29 LAB — HEPATITIS B SURFACE ANTIGEN: Hepatitis B Surface Ag: NONREACTIVE

## 2023-09-29 LAB — IGA: Immunoglobulin A: 150 mg/dL (ref 47–310)

## 2023-09-29 LAB — ALPHA-1-ANTITRYPSIN: A-1 Antitrypsin, Ser: 177 mg/dL (ref 83–199)

## 2023-09-29 LAB — IGG: IgG (Immunoglobin G), Serum: 1200 mg/dL (ref 600–1640)

## 2023-09-29 LAB — TISSUE TRANSGLUTAMINASE, IGA: (tTG) Ab, IgA: <1 U/mL

## 2023-09-30 ENCOUNTER — Encounter (HOSPITAL_BASED_OUTPATIENT_CLINIC_OR_DEPARTMENT_OTHER): Admitting: Cardiovascular Disease

## 2023-09-30 ENCOUNTER — Ambulatory Visit (AMBULATORY_SURGERY_CENTER): Admitting: Internal Medicine

## 2023-09-30 ENCOUNTER — Encounter: Payer: Self-pay | Admitting: Internal Medicine

## 2023-09-30 VITALS — BP 142/84 | HR 78 | Temp 98.2°F | Resp 12 | Ht 65.0 in | Wt 245.0 lb

## 2023-09-30 DIAGNOSIS — K5730 Diverticulosis of large intestine without perforation or abscess without bleeding: Secondary | ICD-10-CM

## 2023-09-30 DIAGNOSIS — K648 Other hemorrhoids: Secondary | ICD-10-CM | POA: Diagnosis not present

## 2023-09-30 DIAGNOSIS — Z83719 Family history of colon polyps, unspecified: Secondary | ICD-10-CM

## 2023-09-30 DIAGNOSIS — Z1211 Encounter for screening for malignant neoplasm of colon: Secondary | ICD-10-CM

## 2023-09-30 DIAGNOSIS — Z8371 Family history of adenomatous and serrated polyps: Secondary | ICD-10-CM

## 2023-09-30 MED ORDER — SODIUM CHLORIDE 0.9 % IV SOLN: 500.0000 mL | INTRAVENOUS | Status: DC

## 2023-09-30 NOTE — Progress Notes (Signed)
 Pt's states no medical or surgical changes since previsit or office visit.

## 2023-09-30 NOTE — Patient Instructions (Signed)
-  Handout on diverticulosis provided. .-repeat colonoscopy in 5 years for surveillance recommended.  -Continue present medications.   YOU HAD AN ENDOSCOPIC PROCEDURE TODAY AT THE [facility]:   Refer to the procedure report that was given to you for any specific questions about what was found during the examination.  If the procedure report does not answer your questions, please call your gastroenterologist to clarify.  If you requested that your care partner not be given the details of your procedure findings, then the procedure report has been included in a sealed envelope for you to review at your convenience later.  YOU SHOULD EXPECT: Some feelings of bloating in the abdomen. Passage of more gas than usual.  Walking can help get rid of the air that was put into your GI tract during the procedure and reduce the bloating. If you had a lower endoscopy (such as a colonoscopy or flexible sigmoidoscopy) you may notice spotting of blood in your stool or on the toilet paper. If you underwent a bowel prep for your procedure, you may not have a normal bowel movement for a few days.  Please Note:  You might notice some irritation and congestion in your nose or some drainage.  This is from the oxygen used during your procedure.  There is no need for concern and it should clear up in a day or so.  SYMPTOMS TO REPORT IMMEDIATELY:  Following lower endoscopy (colonoscopy or flexible sigmoidoscopy):  Excessive amounts of blood in the stool  Significant tenderness or worsening of abdominal pains  Swelling of the abdomen that is new, acute  Fever of 100F or higher  For urgent or emergent issues, a gastroenterologist can be reached at any hour by calling [phone]. Do not use MyChart messaging for urgent concerns.    DIET:  We do recommend a small meal at first, but then you may proceed to your regular diet.  Drink plenty of fluids but you should avoid alcoholic beverages for 24  hours.  ACTIVITY:  You should plan to take it easy for the rest of today and you should NOT DRIVE or use heavy machinery until tomorrow (because of the sedation medicines used during the test).    FOLLOW UP: Our staff will call the number listed on your records the next business day following your procedure.  We will call around 7:15- 8:00 am to check on you and address any questions or concerns that you may have regarding the information given to you following your procedure. If we do not reach you, we will leave a message.     If any biopsies were taken you will be contacted by phone or by letter within the next 1-3 weeks.  Please call us  at [phone] if you have not heard about the biopsies in 3 weeks.    SIGNATURES/CONFIDENTIALITY: You and/or your care partner have signed paperwork which will be entered into your electronic medical record.  These signatures attest to the fact that that the information above on your After Visit Summary has been reviewed and is understood.  Full responsibility of the confidentiality of this discharge information lies with you and/or your care-partner.

## 2023-09-30 NOTE — Op Note (Signed)
 [facility] Patient Name: Rebecca Fry Procedure Date: 09/30/2023 3:35 PM MRN: [id number] Endoscopist: Pedro Bourgeois , , [id number] Age: 38 Referring MD:  Date of Birth: Dec 25, 1985 Gender: Female Account #: [id number] Procedure:                Colonoscopy Indications:              Colon cancer screening in patient at increased                            risk: Family history of 1st-degree relative with                            colon polyps before age 12 years Medicines:                Monitored Anesthesia Care Procedure:                Pre-Anesthesia Assessment:                           - Prior to the procedure, a History and Physical                            was performed, and patient medications and                            allergies were reviewed. The patient's tolerance of                            previous anesthesia was also reviewed. The risks                            and benefits of the procedure and the sedation                            options and risks were discussed with the patient.                            All questions were answered, and informed consent                            was obtained. Prior Anticoagulants: The patient has                            taken no anticoagulant or antiplatelet agents. ASA                            Grade Assessment: II - A patient with mild systemic                            disease. After reviewing the risks and benefits,                            the patient was deemed in satisfactory condition to  undergo the procedure.                           After obtaining informed consent, the colonoscope                            was passed under direct vision. Throughout the                            procedure, the patient's blood pressure, pulse, and                            oxygen saturations were monitored continuously. The                            Colonoscope was  introduced through the anus and                            advanced to the the terminal ileum. The colonoscopy                            was performed without difficulty. The patient                            tolerated the procedure well. The quality of the                            bowel preparation was excellent. The terminal                            ileum, ileocecal valve, appendiceal orifice, and                            rectum were photographed. Scope In: 3:43:59 PM Scope Out: 3:59:51 PM Scope Withdrawal Time: 0 hours 11 minutes 6 seconds  Total Procedure Duration: 0 hours 15 minutes 52 seconds  Findings:                 The terminal ileum appeared normal.                           Multiple diverticula were found in the sigmoid                            colon and descending colon.                           Non-bleeding internal hemorrhoids were found during                            retroflexion. Complications:            No immediate complications. Estimated Blood Loss:     Estimated blood loss: none. Impression:               - The examined portion of the ileum was normal.                           -  Diverticulosis in the sigmoid colon and in the                            descending colon.                           - Non-bleeding internal hemorrhoids.                           - No specimens collected. Recommendation:           - Discharge patient to home (with escort).                           - Repeat colonoscopy in 5 years for screening                            purposes.                           - The findings and recommendations were discussed                            with the patient. Dr Apolonio Kluver,  09/30/2023 4:02:56 PM

## 2023-09-30 NOTE — Progress Notes (Unsigned)
 GASTROENTEROLOGY PROCEDURE H&P NOTE   Primary Care Physician: Jonathon Neighbors, MD    Reason for Procedure:   Family history of colon polyps  Plan:    Colonoscopy   Patient is appropriate for endoscopic procedure(s) in the ambulatory ([facility]) setting.  The nature of the procedure, as well as the risks, benefits, and alternatives were carefully and thoroughly reviewed with the patient. Ample time for discussion and questions allowed. The patient understood, was satisfied, and agreed to proceed.     HPI: Rebecca Fry is a 38 y.o. female who presents for colonoscopy for evaluation of family history of colon polyps.  Patient was most recently seen in the Gastroenterology Clinic on 09/23/23.  No interval change in medical history since that appointment. Please refer to that note for full details regarding GI history and clinical presentation.   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   SAB (spontaneous abortion)    SOB (shortness of breath)    SVD (spontaneous vaginal delivery)    x 3   Yeast infection     Past Surgical History:  Procedure Laterality Date   DILATION AND CURETTAGE OF UTERUS  2007   HYSTEROSCOPY WITH D & C  04/05/2012   Procedure: DILATATION AND CURETTAGE /HYSTEROSCOPY;  Surgeon: Stevenson Elbe, MD;  Location: [facility];  Service: Gynecology;  Laterality: N/A;   LAPAROSCOPY N/A 10/31/2014   Procedure: LAPAROSCOPY OPERATIVE;  Surgeon: Stevenson Elbe, MD;  Location: [facility];  Service: Gynecology;  Laterality: N/A;  Right  Tubal Biopsy   lipomas removal  2009   in [place]   TONSILECTOMY, ADENOIDECTOMY, BILATERAL MYRINGOTOMY AND TUBES     WISDOM TOOTH EXTRACTION  2015    Prior to Admission medications   Medication Sig Start Date End Date Taking? Authorizing Provider  allopurinol (ZYLOPRIM) 100 MG tablet Take 100 mg by mouth daily.   Yes [provider]  amitriptyline (ELAVIL) 25 MG tablet Take 25 mg by mouth at bedtime.   Yes [provider]  carvedilol  (COREG ) 12.5 MG tablet Take 1 tablet (12.5 mg total) by mouth 2 (two) times daily. 05/27/23 09/30/23 Yes Maudine Sos, MD  simvastatin (ZOCOR) 10 MG tablet Take 10 mg by mouth at bedtime. 01/07/23  Yes [provider]  spironolactone  (ALDACTONE ) 50 MG tablet Take 1 tablet (50 mg total) by mouth daily. 12/31/22  Yes Maudine Sos, MD  telmisartan  (MICARDIS ) 80 MG tablet Take 1 tablet (80 mg total) by mouth daily. 06/29/23  Yes Maudine Sos, MD  acetaminophen  (TYLENOL ) 325 MG tablet Take 2 tablets (650 mg total) by mouth every 6 (six) hours as needed for moderate pain. 09/10/22   Adolph Hoop, PA-C  albuterol  (VENTOLIN  HFA) 108 (90 Base) MCG/ACT inhaler TAKE 2 PUFFS EVERY 4-6 HOURSS AS NEEDED    [provider]  ALPRAZolam (XANAX) 0.5 MG tablet Take 0.5 mg by mouth at  bedtime as needed for anxiety.    [provider]  BREZTRI AEROSPHERE 160-9-4.8 MCG/ACT AERO Inhale into the lungs. 03/25/23   [provider]  colchicine 0.6 MG tablet Take 0.6 mg by mouth daily as needed. Patient not taking: Reported on 09/30/2023 03/25/23   [provider]    Current Outpatient Medications  Medication Sig Dispense Refill   allopurinol (ZYLOPRIM) 100 MG tablet Take 100 mg by mouth daily.     amitriptyline (ELAVIL) 25 MG tablet Take 25 mg by mouth at bedtime.     carvedilol  (COREG ) 12.5 MG tablet Take 1 tablet (12.5 mg total) by mouth 2 (two) times daily. 180 tablet 3   simvastatin (ZOCOR) 10 MG tablet Take 10 mg by  mouth at bedtime.     spironolactone  (ALDACTONE ) 50 MG tablet Take 1 tablet (50 mg total) by mouth daily. 30 tablet 6   telmisartan  (MICARDIS ) 80 MG tablet Take 1 tablet (80 mg total) by mouth daily. 90 tablet 3   acetaminophen  (TYLENOL ) 325 MG tablet Take 2 tablets (650 mg total) by mouth every 6 (six) hours as needed for moderate pain. 30 tablet 0   albuterol  (VENTOLIN  HFA) 108 (90 Base) MCG/ACT inhaler TAKE 2 PUFFS EVERY 4-6 HOURSS AS NEEDED     ALPRAZolam (XANAX) 0.5 MG tablet Take 0.5 mg by mouth at bedtime as needed for anxiety.     BREZTRI AEROSPHERE 160-9-4.8 MCG/ACT AERO Inhale into the lungs.     colchicine 0.6 MG tablet Take 0.6 mg by mouth daily as needed. (Patient not taking: Reported on 09/30/2023)     No current facility-administered medications for this visit.    Allergies as of 09/30/2023 - Review Complete 09/30/2023  Allergen Reaction Noted   Hydrocodone  Itching 02/01/2013    Family History  Problem Relation Age of Onset   Hypertension Mother    Anemia Mother    Heart disease Father    Hypertension Father    Diabetes Father    Diabetes Maternal Aunt    Stroke Maternal Aunt    Diabetes Paternal Uncle    Hypertension Maternal Grandmother    Hypertension Maternal Grandfather    Heart attack Paternal Grandmother    Hypertension Paternal Grandmother    Irritable bowel syndrome Paternal Grandmother    Thyroid disease Paternal Grandmother    Heart attack Paternal Grandfather    Hypertension Paternal Grandfather    Heart disease Paternal Grandfather    Stroke Cousin    Colon cancer Neg Hx    Esophageal cancer Neg Hx    Rectal cancer Neg Hx     Social History   Socioeconomic History   Marital status: Divorced    Spouse name: Not on file   Number of children: Not on file   Years of education: Not on file   Highest education level: Not on file  Occupational History   Not on file  Tobacco Use   Smoking status: Never   Smokeless tobacco: Never  Vaping Use    Vaping status: Never Used  Substance and Sexual Activity   Alcohol use: No   Drug use: No   Sexual activity: Yes    Partners: Male    Birth control/protection: None, I.U.D.    Comment: skyla   Other Topics Concern   Not on file  Social History Narrative   Not on file   Social Drivers of Health   Financial Resource Strain: Not on file  Food Insecurity: Not on file  Transportation Needs: Not on  file  Physical Activity: Insufficiently Active (07/10/2023)   Received from [facility]   Exercise Vital Sign    On average, how many days per week do you engage in moderate to strenuous exercise (like a brisk walk)?: 3 days    On average, how many minutes do you engage in exercise at this level?: 30 min  Stress: Not on file  Social Connections: Not on file  Intimate Partner Violence: Not on file    Physical Exam: Vital signs in last 24 hours: BP 136/89   Pulse 80   Temp 98.2 F (36.8 C) (Skin)   Ht 5' 5 (1.651 m)   Wt 245 lb (111.1 kg)   SpO2 97%   BMI 40.77 kg/m  GEN: NAD EYE: Sclerae anicteric ENT: MMM CV: Non-tachycardic Pulm: No increased WOB GI: Soft NEURO:  Alert & Oriented   Regino Caprio, MD [facility]   09/30/2023 3:31 PM

## 2023-09-30 NOTE — Progress Notes (Signed)
 A/o x 3, VSS, gd SR's, pleased with anesthesia, report to RN

## 2023-10-01 ENCOUNTER — Telehealth: Payer: Self-pay

## 2023-10-01 NOTE — Telephone Encounter (Signed)
  Follow up Call-     09/30/2023    3:16 PM  Call back number  Post procedure Call Back phone  # [phone]  Permission to leave phone message Yes     Patient questions:  Do you have a fever, pain , or abdominal swelling? No. Pain Score  0 *  Have you tolerated food without any problems? Yes.    Have you been able to return to your normal activities? Yes.    Do you have any questions about your discharge instructions: Diet   No. Medications  No. Follow up visit  No.  Do you have questions or concerns about your Care? No.  Actions: * If pain score is 4 or above: No action needed, pain <4.

## 2023-10-26 ENCOUNTER — Ambulatory Visit
Admission: RE | Admit: 2023-10-26 | Discharge: 2023-10-26 | Disposition: A | Discharge status: Home / Self Care | Source: Ambulatory Visit | Attending: Physician Assistant | Admitting: Physician Assistant

## 2023-10-26 DIAGNOSIS — K769 Liver disease, unspecified: Secondary | ICD-10-CM | POA: Diagnosis present

## 2023-10-26 DIAGNOSIS — K760 Fatty (change of) liver, not elsewhere classified: Secondary | ICD-10-CM | POA: Insufficient documentation

## 2023-10-26 MED ORDER — GADOXETATE DISODIUM 0.25 MMOL/ML IV SOLN
10.0000 mL | Freq: Once | INTRAVENOUS | Status: AC | PRN
  Administered 2023-10-26: 10 mL via INTRAVENOUS

## 2024-01-04 ENCOUNTER — Ambulatory Visit (HOSPITAL_BASED_OUTPATIENT_CLINIC_OR_DEPARTMENT_OTHER): Admitting: Cardiovascular Disease

## 2024-01-04 ENCOUNTER — Encounter (HOSPITAL_BASED_OUTPATIENT_CLINIC_OR_DEPARTMENT_OTHER): Payer: Self-pay | Admitting: Cardiovascular Disease

## 2024-01-04 VITALS — BP 188/80 | HR 77 | Resp 17 | Ht 65.0 in | Wt 222.0 lb

## 2024-01-04 DIAGNOSIS — K760 Fatty (change of) liver, not elsewhere classified: Secondary | ICD-10-CM | POA: Diagnosis not present

## 2024-01-04 DIAGNOSIS — I1A0 Resistant hypertension: Secondary | ICD-10-CM | POA: Diagnosis not present

## 2024-01-04 MED ORDER — AMLODIPINE BESYLATE 10 MG PO TABS: 10.0000 mg | ORAL_TABLET | Freq: Every day | ORAL | 3 refills | Status: DC

## 2024-01-04 MED ORDER — AMLODIPINE BESYLATE 10 MG PO TABS: 10.0000 mg | ORAL_TABLET | Freq: Every day | ORAL | 3 refills | Status: AC

## 2024-01-04 NOTE — Patient Instructions (Signed)
 Medication Instructions:  START AMLODIPINE  10 MG EVERY EVENING  Labwork: NONE  Testing/Procedures: NONE  Follow-Up: 8 TO 10 WEEKS WITH DR [place], CAITLIN W NP, OR PHARM D   Any Other Special Instructions Will Be Listed Below (If Applicable). MONITOR BLOOD PRESSURE 1 TO 2 TIMES A DAY. SEND READINGS VIA MYCHART IN COUPLE OF WEEKS   If you need a refill on your cardiac medications before your next appointment, please call your pharmacy.

## 2024-01-04 NOTE — Progress Notes (Signed)
 Cardiology Office Note:  .    Date:  01/04/2024  ID:  Rebecca Fry, DOB 10-23-1985, MRN [id number] PCP: Rebecca Aland, MD  [facility] Providers Cardiologist:  Rebecca Scarce, MD     History of Present Illness: .    Rebecca Fry is a 38 y.o. female with non-obstructive CAD, hypertension, prediabetes, anxiety, and gout, here for follow up.  She was first seen 12/2022 for evaluation of shortness of breath and palpitations. She saw her PCP, Rebecca Fry, 08/2022 and noted the symptoms. EKG at the time showed sinus rhythm with no arrhythmias. Labs were notable for hypercholesterolemia and normal renal function and electrolytes. Hemoglobin A1c was 5.9%.   She was seen by vascular surgery 10/2022 given RLE chronic venous insufficiency with varicose veins. She had presented to urgent care in 08/2022 due to leg pain which she felt was related. LE duplex was negative for DVT. They discussed ablation but it was recommended to treat conservatively.  She ntoed intermittent CP and arm numbness.  She was referred for coronary CT-A 12/2022 that revealed a calcium score of 0 and a plaque volume of 42.  Spironolactone  was added to her medication regimen.  She was enrolled in our remote patient monitoring study.  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  Spironolactone  was increased to 50 mg.    At her appointment 05/2023 She reported atypical chest discomfort.  BP was ranging 130-160s.  It was controlled in the office.  Renal Dopplers were inconclusive due to poor visualization.   At her visit 06/2023 she reported daily chest pain.  She was exercising regularly.  Telmisartan  was added back to her regimen.  Discussed the use of AI scribe software for clinical note transcription with the patient, who gave verbal consent to proceed.  History of Present Illness Rebecca Fry has been experiencing elevated blood pressure readings at home, ranging from 170/88 to  210/104, with the highest readings occurring at night. Her current medication regimen includes carvedilol , spironolactone , and telmisartan , taken in the morning around 8:15 to 8:30 AM, and in the evening around 7 PM. She discontinued amlodipine  due to itching, initially thought to be caused by hydrochlorothiazide but now suspected to be related to liver issues.  She experiences itching, particularly on the bottom of her feet and hands, which appear jaundiced at times. She describes chest discomfort occurring both during physical activity and at rest, lasting throughout the day, even when in bed. No heartburn is present, and when it does occur, Tums is effective. She feels that something is wrong but is unable to pinpoint the issue.  She underwent a colonoscopy, MRIs, and CT scans since her last visit. A CT scan of the abdomen with contrast revealed mass-like areas in the liver, which are being monitored by her gastroenterologist every six months. She also had a breast mass evaluated, which was deemed fine, with follow-up scheduled every six months.  Her rheumatologist is investigating elevated ANA levels and suspects an inflammatory process, as gout is not believed to be the cause. She still has a menstrual cycle, which her rheumatologist noted is atypical for gout in women. She is scheduled for a follow-up with her rheumatologist in a week to discuss further results.   ROS:  Please see the history of present illness. All other systems are reviewed and negative.  (+) Intermittent chest pain (+) Shortness of breath (+) Diaphoresis  Prior antihypertensives;  Hydrochlorothiazide-gout Olmesartan Amlodipine /HCTZ  Studies Reviewed: SABRA       EKG 12/16/22: Sinus rhythm.  Rate 74 bpm.    12 Day Zio Monitor 06/12/23:   Quality: Fair.  Baseline artifact. Predominant rhythm:  Average heart rate: 84 bpm Max heart rate: 123 bpm Min heart rate: 84 bpm Pauses >2.5 seconds: none   Rare PACs and PVCs.    Coronary CT-A 12/2022:   IMPRESSION: 1. Coronary calcium score of 0. This was 0 percentile for age-, sex, and race-matched controls.  2. Total plaque volume 42 mm3 which is 90 percentile for age- and sex-matched controls (calcified plaque 0 mm3; non-calcified plaque 42 mm3). TPV is (mild).  3. Normal coronary origin with right dominance.  4. Normal coronary arteries.   RECOMMENDATIONS: CAD-RADS 0: No evidence of CAD (0%). Consider non-atherosclerotic causes of chest pain.  Risk Assessment/Calculations:      Physical Exam:    VS:  BP (!) 188/80   Pulse 77   Resp 17   Ht 5' 5 (1.651 m)   Wt 222 lb (100.7 kg)   SpO2 100%   BMI 36.94 kg/m  , BMI Body mass index is 36.94 kg/m. GENERAL:  Well appearing HEENT: Pupils equal round and reactive, fundi not visualized, oral mucosa unremarkable NECK:  No jugular venous distention, waveform within normal limits, carotid upstroke brisk and symmetric, no bruits, no thyromegaly LUNGS:  Clear to auscultation bilaterally HEART:  RRR.  PMI not displaced or sustained,S1 and S2 within normal limits, no S3, no S4, no clicks, no rubs, no murmurs ABD:  Flat, positive bowel sounds normal in frequency in pitch, no bruits, no rebound, no guarding, no midline pulsatile mass, no hepatomegaly, no splenomegaly EXT:  2 plus pulses throughout, no edema, no cyanosis no clubbing SKIN:  No rashes no nodules NEURO:  Cranial nerves II through XII grossly intact, motor grossly intact throughout PSYCH:  Cognitively intact, oriented to person place and time  Wt Readings from Last 3 Encounters:  01/04/24 222 lb (100.7 kg)  09/30/23 245 lb (111.1 kg)  09/23/23 245 lb 12.8 oz (111.5 kg)     ASSESSMENT AND PLAN: .    Assessment & Plan # Resistant hypertension Hypertension poorly controlled with current regimen. Home readings elevated, especially at night. Amlodipine  reintroduction considered due to previous discontinuation for suspected itching, now  attributed to liver issues. - Reintroduce amlodipine  10 mg at night. - Start checking blood pressure next week, report via MyChart. - Consider doubling carvedilol  if hypertension persists post-amlodipine . - Discuss renal denervation if hypertension remains uncontrolled. - Continue telmisartan  and spironolactone .   - Secondary causes have been evaluated.   # Chest pain, non-cardiac Intermittent chest pain likely non-cardiac. Normal heart arteries. Pain occurs at rest and during activity. High blood pressure may contribute to discomfort.  # Psychological stress Elevated stress levels potentially contributing to high blood pressure and inflammation. Stress linked to health uncertainty and elevated inflammatory markers.  # Liver adenomas under surveillance # Hepatic steatosis.  Liver adenomas likely benign, under GI surveillance with biannual follow-up. -Continue working on weight loss efforts.    Signed, Rebecca Scarce, MD

## 2024-01-26 ENCOUNTER — Encounter (HOSPITAL_BASED_OUTPATIENT_CLINIC_OR_DEPARTMENT_OTHER): Payer: Self-pay | Admitting: Cardiovascular Disease

## 2024-03-02 ENCOUNTER — Encounter (HOSPITAL_BASED_OUTPATIENT_CLINIC_OR_DEPARTMENT_OTHER): Payer: Self-pay | Admitting: Cardiovascular Disease

## 2024-03-02 ENCOUNTER — Ambulatory Visit (HOSPITAL_BASED_OUTPATIENT_CLINIC_OR_DEPARTMENT_OTHER): Admitting: Cardiovascular Disease

## 2024-03-02 VITALS — BP 136/74 | HR 91 | Ht 65.0 in | Wt 223.1 lb

## 2024-03-02 DIAGNOSIS — I1A0 Resistant hypertension: Secondary | ICD-10-CM | POA: Diagnosis not present

## 2024-03-02 DIAGNOSIS — K760 Fatty (change of) liver, not elsewhere classified: Secondary | ICD-10-CM | POA: Diagnosis not present

## 2024-03-02 DIAGNOSIS — Z5181 Encounter for therapeutic drug level monitoring: Secondary | ICD-10-CM | POA: Diagnosis not present

## 2024-03-02 DIAGNOSIS — I10 Essential (primary) hypertension: Secondary | ICD-10-CM

## 2024-03-02 MED ORDER — SPIRONOLACTONE 50 MG PO TABS: 50.0000 mg | ORAL_TABLET | Freq: Every day | ORAL | 6 refills | Status: AC

## 2024-03-02 NOTE — Progress Notes (Unsigned)
 Cardiology Office Note:  .    Date:  03/12/2024  ID:  Rebecca Fry, DOB [birth year], MRN [id number] PCP: Benjamine Aland, MD  [facility] Providers Cardiologist:  Annabella Scarce, MD     History of Present Illness: .    Rebecca Fry is a 38 y.o. female with non-obstructive CAD, hypertension, prediabetes, anxiety, and gout, here for follow up.  She was first seen 12/2022 for evaluation of shortness of breath and palpitations. She saw her PCP, Dr. Benjamine, 08/2022 and noted the symptoms. EKG at the time showed sinus rhythm with no arrhythmias. Labs were notable for hypercholesterolemia and normal renal function and electrolytes. Hemoglobin A1c was 5.9%.   She was seen by vascular surgery 10/2022 given RLE chronic venous insufficiency with varicose veins. She had presented to urgent care in 08/2022 due to leg pain which she felt was related. LE duplex was negative for DVT. They discussed ablation but it was recommended to treat conservatively.  She ntoed intermittent CP and arm numbness.  She was referred for coronary CT-A 12/2022 that revealed a calcium score of 0 and a plaque volume of 42.  Spironolactone  was added to her medication regimen.  She was enrolled in our remote patient monitoring study.  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  Spironolactone  was increased to 50 mg.    At her appointment 05/2023 She reported atypical chest discomfort.  BP was ranging 130-160s.  It was controlled in the office.  Renal Dopplers were inconclusive due to poor visualization.   At her visit 06/2023 she reported daily chest pain.  She was exercising regularly.  Telmisartan  was added back to her regimen.  At her visit 12/2023 BP was very elevated.  She had discontinued amlodipine  due to itching.  This was subsequently found to be due to liver disease and amlodipine  was resumed.  SHe was also struggling with several life stressors.   Discussed the use of AI  scribe software for clinical note transcription with the patient, who gave verbal consent to proceed.  History of Present Illness Rebecca Fry presents for blood pressure management. She has a history of hypertension and recently underwent a 24-hour blood pressure monitoring, which showed systolic readings between 170-180 mmHg and diastolic readings between 80-101 mmHg. The monitoring was conducted a few weeks ago, around the last week of October or the first week of November. She is currently on amlodipine . She is taking carvedilol  and was previously on propranolol, but has stopped propranolol due to its similarity to carvedilol . She is also on spironolactone  and has recently stopped potassium supplements. Her home blood pressure readings are reportedly higher than those recorded in the office.  She is on a weight loss regimen involving GLP-1 receptor agonist injections, which she receives weekly from Nat Lewis, a practitioner in integrative medicine. She reports significant weight loss from 250 pounds and is currently on a 0.7 mg dose. She notes a change in the effectiveness of the medication after a change in vendors.  She has been prescribed furosemide (Lasix) for swelling, which was present during her blood pressure monitoring. Additionally, she has started estrogen therapy, which may increase the risk of blood clots, and doxycycline, which is already on her medication list.  She engages in regular exercise and reports weight loss.   ROS:  Please see the history of present illness. All other systems are reviewed and negative.  (+) Intermittent chest pain (+) Shortness of  breath (+) Diaphoresis  Prior antihypertensives;  Hydrochlorothiazide-gout Olmesartan Amlodipine /HCTZ  Studies Reviewed: SABRA       EKG 12/16/22: Sinus rhythm.  Rate 74 bpm.    12 Day Zio Monitor 06/12/23:   Quality: Fair.  Baseline artifact. Predominant rhythm:  Average heart rate: 84 bpm Max heart rate: 123  bpm Min heart rate: 84 bpm Pauses >2.5 seconds: none   Rare PACs and PVCs.   Coronary CT-A 12/2022:   IMPRESSION: 1. Coronary calcium score of 0. This was 0 percentile for age-, sex, and race-matched controls.  2. Total plaque volume 42 mm3 which is 90 percentile for age- and sex-matched controls (calcified plaque 0 mm3; non-calcified plaque 42 mm3). TPV is (mild).  3. Normal coronary origin with right dominance.  4. Normal coronary arteries.   RECOMMENDATIONS: CAD-RADS 0: No evidence of CAD (0%). Consider non-atherosclerotic causes of chest pain.  Risk Assessment/Calculations:      Physical Exam:    VS:  BP 136/74   Pulse 91   Ht 5' 5 (1.651 m)   Wt 223 lb 1.6 oz (101.2 kg)   SpO2 96%   BMI 37.13 kg/m  , BMI Body mass index is 37.13 kg/m. GENERAL:  Well appearing HEENT: Pupils equal round and reactive, fundi not visualized, oral mucosa unremarkable NECK:  No jugular venous distention, waveform within normal limits, carotid upstroke brisk and symmetric, no bruits, no thyromegaly LUNGS:  Clear to auscultation bilaterally HEART:  RRR.  PMI not displaced or sustained,S1 and S2 within normal limits, no S3, no S4, no clicks, no rubs, no murmurs ABD:  Flat, positive bowel sounds normal in frequency in pitch, no bruits, no rebound, no guarding, no midline pulsatile mass, no hepatomegaly, no splenomegaly EXT:  2 plus pulses throughout, no edema, no cyanosis no clubbing SKIN:  No rashes no nodules NEURO:  Cranial nerves II through XII grossly intact, motor grossly intact throughout PSYCH:  Cognitively intact, oriented to person place and time  Wt Readings from Last 3 Encounters:  03/02/24 223 lb 1.6 oz (101.2 kg)  01/04/24 222 lb (100.7 kg)  09/30/23 245 lb (111.1 kg)     ASSESSMENT AND PLAN: .    Assessment & Plan Resistant hypertension Home blood pressure significantly higher than in-office. Current regimen includes amlodipine  and carvedilol . Propranolol discontinued.  Potassium supplementation stopped. - Increased spironolactone  to 50 mg daily. - Discontinued potassium supplementation. - Ordered basic metabolic panel in one week to monitor potassium levels. - Continue home blood pressure monitoring.  Morbid obesity due to excess calories Weight loss achieved with GLP-1 receptor agonist and exercise. Concerns about compounded medication regulation. Discussed benefits of name-brand medications like Ozempic  or Wegovy . - Obtain documentation of current GLP-1 receptor agonist from integrative medicine provider. - Consider switching to name-brand GLP-1 receptor agonist for better regulation and cost-effectiveness.  Recording duration: 19 minutes   Signed, Annabella Scarce, MD

## 2024-03-02 NOTE — Patient Instructions (Addendum)
 Medication Instructions:  INCREASE SPIRONOLACTONE  TO 50 MG DAILY   STOP POTASSIUM   STOP PROPRANOLOL   Labwork: BMET IN 1 WEEK   Testing/Procedures: NONE   Follow-Up: 3 MONTHS WITH CAITLIN W NP OR DR [place]   If you need a refill on your cardiac medications before your next appointment, please call your pharmacy.

## 2024-03-09 ENCOUNTER — Other Ambulatory Visit (HOSPITAL_BASED_OUTPATIENT_CLINIC_OR_DEPARTMENT_OTHER): Payer: Self-pay | Admitting: *Deleted

## 2024-03-09 ENCOUNTER — Encounter (HOSPITAL_BASED_OUTPATIENT_CLINIC_OR_DEPARTMENT_OTHER): Payer: Self-pay | Admitting: Cardiovascular Disease

## 2024-03-09 DIAGNOSIS — I1A0 Resistant hypertension: Secondary | ICD-10-CM

## 2024-03-09 MED ORDER — CHLORTHALIDONE 25 MG PO TABS: 25.0000 mg | ORAL_TABLET | Freq: Every day | ORAL | 3 refills | Status: AC

## 2024-03-14 ENCOUNTER — Encounter (HOSPITAL_BASED_OUTPATIENT_CLINIC_OR_DEPARTMENT_OTHER): Payer: Self-pay | Admitting: Cardiovascular Disease

## 2024-03-16 ENCOUNTER — Encounter (HOSPITAL_BASED_OUTPATIENT_CLINIC_OR_DEPARTMENT_OTHER): Admitting: Family

## 2024-03-17 ENCOUNTER — Other Ambulatory Visit: Payer: Self-pay

## 2024-03-17 ENCOUNTER — Encounter (HOSPITAL_BASED_OUTPATIENT_CLINIC_OR_DEPARTMENT_OTHER): Payer: Self-pay

## 2024-03-17 ENCOUNTER — Emergency Department (HOSPITAL_BASED_OUTPATIENT_CLINIC_OR_DEPARTMENT_OTHER): Admitting: Radiology

## 2024-03-17 ENCOUNTER — Emergency Department (HOSPITAL_BASED_OUTPATIENT_CLINIC_OR_DEPARTMENT_OTHER)
Admission: EM | Admit: 2024-03-17 | Discharge: 2024-03-18 | Disposition: A | Attending: Emergency Medicine | Admitting: Emergency Medicine

## 2024-03-17 DIAGNOSIS — M25511 Pain in right shoulder: Secondary | ICD-10-CM | POA: Insufficient documentation

## 2024-03-17 LAB — BASIC METABOLIC PANEL WITH GFR
BUN/Creatinine Ratio: 13 (ref 9–23)
BUN: 9 mg/dL (ref 6–24)
CO2: 25 mmol/L (ref 20–29)
Calcium: 9.5 mg/dL (ref 8.7–10.2)
Chloride: 101 mmol/L (ref 96–106)
Creatinine, Ser: 0.67 mg/dL (ref 0.57–1.00)
Glucose: 69 mg/dL — ABNORMAL LOW (ref 70–99)
Potassium: 3.9 mmol/L (ref 3.5–5.2)
Sodium: 140 mmol/L (ref 134–144)
eGFR: 113 mL/min/1.73 (ref >59)

## 2024-03-17 NOTE — ED Triage Notes (Signed)
 Pt having rt. Shoulder pain Pt states she is having tests to confirm RA Saw her rheumatologist on Monday and was started on Prednisone .  Pt is having minimal relief.   MD said for her to come here for further evaluation.

## 2024-03-18 MED ORDER — OXYCODONE-ACETAMINOPHEN 5-325 MG PO TABS: 1.0000 | ORAL_TABLET | Freq: Four times a day (QID) | ORAL | 0 refills | Status: AC | PRN

## 2024-03-18 NOTE — ED Provider Notes (Signed)
 [facility] EMERGENCY DEPARTMENT AT [facility]  Provider Note  CSN: [id number] Arrival date & time: 03/17/24 2227  History Chief Complaint  Patient presents with   Shoulder Pain    Rebecca Fry is a 38 y.o. female here for several days of R shoulder pain. She is in the process of getting a rheumatology workup, mostly for prior R knee/leg pain but in the last few days has noticed R shoulder pain as well as a bruise with no known injuries. She has been taking Mobic without much relief, she called her Rheumatologist earlier this week and was prescribed steroids which have also not helped. She was advised to come to the ED for further evaluation.    Home Medications Prior to Admission medications   Medication Sig Start Date End Date Taking? Authorizing Provider  oxyCODONE -acetaminophen  (PERCOCET/ROXICET) 5-325 MG tablet Take 1 tablet by mouth every 6 (six) hours as needed for severe pain (pain score 7-10). 03/18/24  Yes Roselyn Carlin NOVAK, MD  acetaminophen  (TYLENOL ) 325 MG tablet Take 2 tablets (650 mg total) by mouth every 6 (six) hours as needed for moderate pain. 09/10/22   Christopher Savannah, PA-C  albuterol  (VENTOLIN  HFA) 108 (90 Base) MCG/ACT inhaler TAKE 2 PUFFS EVERY 4-6 HOURSS AS NEEDED    [provider]  amLODipine  (NORVASC ) 10 MG tablet Take 1 tablet (10 mg total) by mouth daily. 01/04/24 04/03/24  Raford Riggs, MD  BREZTRI AEROSPHERE 160-9-4.8 MCG/ACT AERO Inhale into the lungs. 03/25/23   [provider]  carvedilol  (COREG ) 12.5 MG tablet Take 1 tablet (12.5 mg total) by mouth 2 (two) times daily. 05/27/23 03/02/24  Raford Riggs, MD  chlorthalidone  (HYGROTON ) 25 MG tablet Take 1 tablet (25 mg total) by mouth daily. 03/09/24 06/07/24  Raford Riggs, MD  Cholecalciferol 1.25 MG (50000 UT) capsule TAKE 1 CAPSULE BY MOUTH ONE TIME PER WEEK    [provider]  doxycycline (VIBRA-TABS) 100 MG tablet Take 1 tablet twice a day by oral route for 7  days. 03/01/24   [provider]  semaglutide -weight management (WEGOVY ) 2.4 MG/0.75ML SOAJ SQ injection Inject 2.4 mg into the skin once a week.    [provider]  simvastatin (ZOCOR) 10 MG tablet Take 10 mg by mouth at bedtime. 01/07/23   [provider]  spironolactone  (ALDACTONE ) 50 MG tablet Take 1 tablet (50 mg total) by mouth daily. 03/02/24   Raford Riggs, MD  telmisartan  (MICARDIS ) 80 MG tablet Take 1 tablet (80 mg total) by mouth daily. 06/29/23   Raford Riggs, MD     Allergies    Hydrocodone    Review of Systems   Review of Systems Please see HPI for pertinent positives and negatives  Physical Exam BP (!) 179/95 (BP Location: Left Arm)   Pulse 78   Temp 98.3 F (36.8 C)   Resp 17   Ht 5' 5 (1.651 m)   Wt 101 kg   LMP 02/17/2024 (Exact Date)   SpO2 99%   BMI 37.05 kg/m   Physical Exam Vitals and nursing note reviewed.  HENT:     Head: Normocephalic.     Nose: Nose normal.  Eyes:     Extraocular Movements: Extraocular movements intact.  Cardiovascular:     Pulses: Normal pulses.  Pulmonary:     Effort: Pulmonary effort is normal.  Musculoskeletal:        General: Tenderness (R anterior shoulder) present. No swelling or deformity. Normal range of motion.     Cervical  back: Neck supple.  Skin:    Findings: Bruising (healing bruise, R deltoid) present. No rash (on exposed skin).  Neurological:     Mental Status: She is alert and oriented to person, place, and time.  Psychiatric:        Mood and Affect: Mood normal.     ED Results / Procedures / Treatments   EKG None  Procedures Procedures  Medications Ordered in the ED Medications - No data to display  Initial Impression and Plan  Patient here for atraumatic R shoulder pain. Exam and vitals are reassuring. I personally viewed the images from radiology studies and agree with radiologist interpretation: Xray is neg. No signs of infection or DVT in the RUE.  Recommend continuing NSAIDs, will add oxycodone  as needed for breakthrough short term. Sling for comfort. Outpatient follow up, RTED for any other concerns.   ED Course       MDM Rules/Calculators/A&P Medical Decision Making Problems Addressed: Acute pain of right shoulder: acute illness or injury  Amount and/or Complexity of Data Reviewed Radiology: ordered and independent interpretation performed. Decision-making details documented in ED Course.  Risk Prescription drug management.     Final Clinical Impression(s) / ED Diagnoses Final diagnoses:  Acute pain of right shoulder    Rx / DC Orders ED Discharge Orders          Ordered    oxyCODONE -acetaminophen  (PERCOCET/ROXICET) 5-325 MG tablet  Every 6 hours PRN        03/18/24 0024             Roselyn Carlin NOVAK, MD 03/18/24 [phone]

## 2024-03-18 NOTE — Telephone Encounter (Signed)
 Addressed at follow up visit.   Rebecca Fry S Mallary Kreger, NP

## 2024-04-12 ENCOUNTER — Telehealth: Payer: Self-pay | Admitting: Internal Medicine

## 2024-04-12 ENCOUNTER — Other Ambulatory Visit: Payer: Self-pay

## 2024-04-12 DIAGNOSIS — D134 Benign neoplasm of liver: Secondary | ICD-10-CM

## 2024-04-12 DIAGNOSIS — K760 Fatty (change of) liver, not elsewhere classified: Secondary | ICD-10-CM

## 2024-04-12 NOTE — Telephone Encounter (Signed)
 Inbound call from patient stating she would like to schedule her 6 months MRI like she was advised. Requesting a call back  Please advise  Thank you

## 2024-04-12 NOTE — Telephone Encounter (Signed)
 Chart reviewed and noted recommendations from previous MRI.  Pt was scheduled for the MRI on 04/29/2024 at 9:00 AM. Pt to arrive at 8:30 AM at [facility]; Nothing to eat or drink 4 hours prior. Pt made aware. Pt verbalized understanding with all questions answered.

## 2024-04-16 ENCOUNTER — Ambulatory Visit: Payer: Self-pay | Admitting: Cardiovascular Disease

## 2024-04-18 ENCOUNTER — Ambulatory Visit: Admitting: Internal Medicine

## 2024-04-18 ENCOUNTER — Encounter: Payer: Self-pay | Admitting: Internal Medicine

## 2024-04-18 VITALS — BP 164/82 | HR 99 | Ht 65.0 in | Wt 228.5 lb

## 2024-04-18 DIAGNOSIS — Z6838 Body mass index (BMI) 38.0-38.9, adult: Secondary | ICD-10-CM

## 2024-04-18 DIAGNOSIS — Z83719 Family history of colon polyps, unspecified: Secondary | ICD-10-CM

## 2024-04-18 DIAGNOSIS — D134 Benign neoplasm of liver: Secondary | ICD-10-CM | POA: Diagnosis not present

## 2024-04-18 DIAGNOSIS — K760 Fatty (change of) liver, not elsewhere classified: Secondary | ICD-10-CM | POA: Diagnosis not present

## 2024-04-18 DIAGNOSIS — Z8371 Family history of adenomatous and serrated polyps: Secondary | ICD-10-CM

## 2024-04-18 NOTE — Progress Notes (Signed)
 "    04/18/2024 DAKOTA STANGL [id number] 1986/03/06  ASSESSMENT AND PLAN:   Severe fatty liver Suspected metabolic dysfunction associated seatohepatitis/ MALFLD (MASH, formerly NASH)  by history and ultrasound.  Maternal Aunt with history of cirrhosis was alcoholic, another maternal aunt with liver failure unknown if ETOH played a part     Latest Ref Rng & Units 09/23/2023   11:56 AM 03/07/2014   10:45 PM 02/01/2013    8:22 PM  Hepatic Function  Total Protein 6.1 - 8.1 g/dL 7.7  8.3  8.3   Albumin 3.5 - 5.2 g/dL  4.2  4.2   AST 10 - 30 U/L 18  14  16    ALT 6 - 29 U/L 22  16  16    Alk Phosphatase 39 - 117 U/L  56  62   Total Bilirubin 0.2 - 1.2 mg/dL 0.4  <9.7  0.2   Bilirubin, Direct 0.0 - 0.2 mg/dL 0.1      - Will plan to check LFTs during next appt - Encouraged weight loss  Liver lesions Last MRI 10/26/23 with slightly diminished size of hyperenhancing liver lesions, favored to be hepatic adenomas. Will continue to monitor. Patient does not have some abdominal discomfort but this seems relatively nonspecific. Not on any estrogen containing medications.  - Eovist  MRI abdomen with/without contrast 04/29/24 - Continue MRI with Eovist  contrast every 6 months - Patient will let me know if her abdominal discomfort worsens  family history of multiple polyps at young age Father with multiple polyps and in that family, dad with colon polyps in his 7's, gets colonoscopies twice a year, brother with polyps age 52 and other brother with polyps age 61, both get colonoscopies every 3-5 years.  Last colonoscopy 09/30/23 without polyps - Next colonoscopy due in 09/2028  Morbid obesity  Body mass index is 38.02 kg/m.  - Weight loss as above  Patient Care Team: Benjamine Aland, MD as PCP - General (Family Medicine) Raford Riggs, MD as PCP - Cardiology (Cardiology) Jama, Amy (Inactive) (Cardiology)  HISTORY OF PRESENT ILLNESS: 39 y.o. female with a past medical history listed below  presents for follow-up of hepatic adenomas and fatty liver  Overall she has been doing well.  No major complaints.  She does state that she has some scattered occasional abdominal discomfort that she deals with has not changed significantly over time.  Patient has nausea but this is not excessively bothersome to her.  Otherwise she is eating well and having regular bowel movements.  RELEVANT GI HISTORY, IMAGING AND LABS: Colonoscopy 09/30/23: - The examined portion of the ileum was normal. - Diverticulosis in the sigmoid colon and in the descending colon. - Non- bleeding internal hemorrhoids. - No specimens collected.  CBC    Component Value Date/Time   WBC 6.8 09/23/2023 1156   RBC 4.98 09/23/2023 1156   HGB 13.0 09/23/2023 1156   HCT 42.2 09/23/2023 1156   PLT 472 (H) 09/23/2023 1156   MCV 84.7 09/23/2023 1156   MCH 26.1 (L) 09/23/2023 1156   MCHC 30.8 (L) 09/23/2023 1156   RDW 13.0 09/23/2023 1156   LYMPHSABS 4.1 (H) 03/07/2014 2245   MONOABS 0.5 03/07/2014 2245   EOSABS 170 09/23/2023 1156   BASOSABS 48 09/23/2023 1156   Recent Labs    09/23/23 1156  HGB 13.0    CMP     Component Value Date/Time   NA 140 03/16/2024 1148   K 3.9 03/16/2024 1148   CL 101 03/16/2024 1148  CO2 25 03/16/2024 1148   GLUCOSE 69 (L) 03/16/2024 1148   GLUCOSE 111 (H) 01/30/2016 2323   BUN 9 03/16/2024 1148   CREATININE 0.67 03/16/2024 1148   CALCIUM 9.5 03/16/2024 1148   PROT 7.7 09/23/2023 1156   ALBUMIN 4.2 03/07/2014 2245   AST 18 09/23/2023 1156   ALT 22 09/23/2023 1156   ALKPHOS 56 03/07/2014 2245   BILITOT 0.4 09/23/2023 1156   GFRNONAA >60 01/30/2016 2323   GFRAA >60 01/30/2016 2323      Latest Ref Rng & Units 09/23/2023   11:56 AM 03/07/2014   10:45 PM 02/01/2013    8:22 PM  Hepatic Function  Total Protein 6.1 - 8.1 g/dL 7.7  8.3  8.3   Albumin 3.5 - 5.2 g/dL  4.2  4.2   AST 10 - 30 U/L 18  14  16    ALT 6 - 29 U/L 22  16  16    Alk Phosphatase 39 - 117 U/L  56  62    Total Bilirubin 0.2 - 1.2 mg/dL 0.4  <9.7  0.2   Bilirubin, Direct 0.0 - 0.2 mg/dL 0.1         Current Medications:   Current Outpatient Medications (Endocrine & Metabolic):    medroxyPROGESTERone  (PROVERA ) 10 MG tablet, Oral   predniSONE  (DELTASONE ) 20 MG tablet, Take 20 mg by mouth daily.  Current Outpatient Medications (Cardiovascular):    amLODipine  (NORVASC ) 10 MG tablet, Take 1 tablet (10 mg total) by mouth daily.   carvedilol  (COREG ) 12.5 MG tablet, Take 1 tablet (12.5 mg total) by mouth 2 (two) times daily.   chlorthalidone  (HYGROTON ) 25 MG tablet, Take 1 tablet (25 mg total) by mouth daily.   simvastatin (ZOCOR) 10 MG tablet, Take 10 mg by mouth at bedtime.   spironolactone  (ALDACTONE ) 50 MG tablet, Take 1 tablet (50 mg total) by mouth daily.   telmisartan  (MICARDIS ) 80 MG tablet, Take 1 tablet (80 mg total) by mouth daily.  Current Outpatient Medications (Respiratory):    albuterol  (VENTOLIN  HFA) 108 (90 Base) MCG/ACT inhaler, TAKE 2 PUFFS EVERY 4-6 HOURSS AS NEEDED   BREZTRI AEROSPHERE 160-9-4.8 MCG/ACT AERO, Inhale into the lungs.   promethazine  (PHENERGAN ) 25 MG tablet, Oral  Current Outpatient Medications (Analgesics):    meloxicam (MOBIC) 15 MG tablet, Take 15 mg by mouth daily as needed.   oxyCODONE -acetaminophen  (PERCOCET/ROXICET) 5-325 MG tablet, Take 1 tablet by mouth every 6 (six) hours as needed for severe pain (pain score 7-10).   traMADol (ULTRAM) 50 MG tablet, Oral  Current Outpatient Medications (Other):    potassium chloride (KLOR-CON) 10 MEQ tablet, Take 10 mEq by mouth daily.   semaglutide -weight management (WEGOVY ) 2.4 MG/0.75ML SOAJ SQ injection, Inject 2.4 mg into the skin once a week.   sulfamethoxazole-trimethoprim (BACTRIM DS) 800-160 MG tablet, Oral  Medical History:  Past Medical History:  Diagnosis Date   Abnormal Pap smear    colpo   Amenorrhea    Anemia    Anxiety    Asthma    Bladder infection    BV (bacterial vaginosis)    Chest  pain of uncertain etiology 12/16/2022   Chlamydia    Gout    History of chicken pox    Hyperlipidemia    Hypertension    Migraine    otc med prn   Ovarian cyst    Palpitations 05/27/2023   PONV (postoperative nausea and vomiting)    after surgery in 2009-nausea & vomiting   Resistant hypertension 03/15/2012  SAB (spontaneous abortion)    SOB (shortness of breath)    SVD (spontaneous vaginal delivery)    x 3   Yeast infection    Allergies:  Allergies  Allergen Reactions   Hydrocodone  Itching     Surgical History:  She  has a past surgical history that includes Tonsilectomy, adenoidectomy, bilateral myringotomy and tubes; lipomas removal (2009); Dilation and curettage of uterus (2007); Hysteroscopy with D & C (04/05/2012); Wisdom tooth extraction (2015); and laparoscopy (N/A, 10/31/2014). Family History:  Her family history includes Anemia in her mother; Diabetes in her father, maternal aunt, and paternal uncle; Heart attack in her paternal grandfather and paternal grandmother; Heart disease in her father and paternal grandfather; Hypertension in her father, maternal grandfather, maternal grandmother, mother, paternal grandfather, and paternal grandmother; Irritable bowel syndrome in her paternal grandmother; Stroke in her cousin and maternal aunt; Thyroid disease in her paternal grandmother.   PHYSICAL EXAM: BP (!) 164/82   Pulse 99   Ht 5' 5 (1.651 m)   Wt 228 lb 8 oz (103.6 kg)   BMI 38.02 kg/m  Physical Exam   General: Well appearing Resp: CTAB CV: RRR Abd: Mild scattered tenderness  Rosario JAYSON Kidney, MD 11:09 AM  I spent 36 minutes of time, including in depth chart review, independent review of results as outlined above, communicating results with the patient directly, face-to-face time with the patient, coordinating care, and ordering studies and medications as appropriate, and documentation.   "

## 2024-04-29 ENCOUNTER — Ambulatory Visit (HOSPITAL_COMMUNITY): Admission: RE | Admit: 2024-04-29 | Source: Ambulatory Visit

## 2024-05-04 ENCOUNTER — Telehealth: Payer: Self-pay

## 2024-05-04 NOTE — Telephone Encounter (Addendum)
 Reminder was received in Epic for a repeat MRI.  Chart reviewed and noted where MRI was completed through [facility] on 04/23/2024.  Please review and advise.

## 2024-05-04 NOTE — Telephone Encounter (Signed)
 Personal reminder placed in Epic to repeat MRI in 6 months

## 2024-05-04 NOTE — Telephone Encounter (Signed)
-----   Message from Nurse Rock DEL, RN sent at 05/04/2024  9:25 AM EST ----- Regarding: FW: 71-month MRI  ----- Message ----- From: Mercer Cristino SAILOR, RN Sent: 04/29/2024  12:00 AM EST To: Cristino SAILOR Mercer, RN Subject: 47-month MRI                                    MRI abdomen with Eovist  liver lesions - need to order - Craig

## 2024-05-04 NOTE — Telephone Encounter (Signed)
 Rebecca Fry

## 2024-05-18 ENCOUNTER — Telehealth: Payer: Self-pay | Admitting: Cardiovascular Disease

## 2024-05-18 NOTE — Progress Notes (Unsigned)
 " Cardiology Office Note   Date:  05/19/2024  ID:  Rebecca Fry, DOB 1985-12-05, MRN [id number] PCP: Sebastian Jerilyn HERO, FNP  [facility] Providers Cardiologist:  Annabella Scarce, MD     PMH Resistant hypertension Prediabetes Gout Anxiety Hypercholesterolemia Hepatic steatosis  Prior antihypertensives Hydrochlorothiazide - gout Olmesartan Amlodipine /hydrochlorothiazide  She established with Dr. Scarce for management of hypertension 12/2022.  Hemoglobin A1c was 5.9% at that time.  A recent EKG showed sinus rhythm with no arrhythmias.  She was experiencing shortness of breath and palpitations.  Seen by vascular surgery 10/2023 given RLE chronic venous insufficiency with varicose veins.  Presented to urgent care 08/2022 due to leg pain and it was felt to be related to this.  LE duplex negative for DVT.  They discussed ablation but it was recommended to treat conservatively.  She reported 5 to 7-minute episodes of chest pain sometimes lasting longer.  BP difficult to control especially since her pregnancy.  She was on bedrest for elevated blood pressure near the end of each pregnancy but was never diagnosed with preeclampsia.  She was on amlodipine  5 mg twice daily for hypertension at that time.  BP in office initially 138/92 and on manual recheck 154/90 right arm, 156/88 left arm.  Noted BP usually higher in right arm.  At home BP ranges from 120/74 to 223/117, often higher in the middle of the day.  She had previously tried telmisartan , hydrochlorothiazide, olmesartan, amlodipine /HCTZ.  HCTZ was discontinued due to gout.  She had had no further episodes of gout with cutting back on canned and [employer] packaged foods.  She was walking for exercise about 2-3 times per week.  She confirmed severe snoring.  Coronary CT 12/24/2022 with CAC of 0, mild total plaque volume 42 mm (90th percentile).  Spironolactone  was added for antihypertensive therapy.  At office visit 05/2023 she reported  atypical chest discomfort.  BP ranging 130-160s, normal in the office.  Renal Dopplers were inconclusive due to poor visualization.  She had discontinued amlodipine  due to itching.  This was ultimately found to be due to liver disease and amlodipine  was resumed.  She reported significant life stressors.  Cardiac monitor 05/2023 revealed predominant sinus rhythm with average HR 84 bpm, rare PACs and PVCs.  Last cardiology clinic visit was 03/02/2024 with Dr. Scarce.  24-hour blood pressure monitoring showed systolic readings between 170 to 180 mmHg and diastolic readings 80 to 101 mmHg.  This was conducted around the last week of October to the first week of November.  She was on amlodipine , carvedilol , spironolactone .  She had previously been on propranolol and had stopped potassium supplements.  Home BP readings higher than those recorded in the office.  Is on weight loss regimen including GLP-1 receptor agonist injections which she receives weekly from a practitioner and integrative medicine.  She was reporting significant weight loss from 250 pounds on the 0.7 mg dose.  She was engaging in regular exercise.  Had been prescribed Lasix for swelling which she was taking during BP monitoring.  Additionally she had started estrogen therapy.  Spironolactone  was increased to 50 mg daily.  She was advised to discontinue potassium supplementation.  She was advised to continue amlodipine  and carvedilol  as well as GLP-1 receptor agonist. Continue home BP monitoring.   Home BP readings reported: 03/09/24 142/97, 162/103, 152/101 - advised to start chlorthalidone  25 mg daily  Kidney function and electrolytes normal on labs completed 03/16/2025.   She contacted our office 05/18/2024 with concerns  of BP 167/105.  Medications were verified including amlodipine  10 mg daily, chlorthalidone  50 mg daily, Aldactone  50 mg daily, telmisartan  40 mg daily, carvedilol  12.5 mg daily, and hydralazine 100 mg twice daily.  History  of Present Illness Discussed the use of AI scribe software for clinical note transcription with the patient, who gave verbal consent to proceed.  History of Present Illness Rebecca Fry is a very pleasant 39 year old female who is here today due to elevated blood pressure readings over the past week.  Home BP on the day prior was 167/105.  BP at PCP visit on 05/14/23 was 164/100. On 05/17/24 she went for GLP-1 injection and BP was 133/89. When her blood pressure is elevated she has dizziness, blurred vision, numbness and tingling in her arms and hands. She reports occasional chest discomfort that does not always occur with exertion.. She denies shortness of breath, palpitations, orthopnea, PND, edema, presyncope, syncope.  She brought medications from home for verification and we also reviewed records from pharmacy.  She has been taking amlodipine  10 mg daily, telmisartan  80 mg daily, carvedilol  12.5 mg daily, hydralazine 100 mg twice daily, chlorthalidone  25 mg daily, and spironolactone  50 mg daily.  There have been no recent changes in antihypertensive medications.  Her PCP prescribed magnesium recently.  She reports occasional LE edema but she no longer takes furosemide. Sleep pattern is limited, often sleeping only a few hours per night. She wakes at 5 AM, takes her morning medications, and checks her blood pressure before work. She works full time and often spends time with her children after work. She eats mostly homemade, lower-sodium foods with reduced appetite and typically has one meal daily with snacks. She walks at work and is physically active with her children. No recent changes that may have contributed to elevations in BP.    ROS: See HPI  Studies Reviewed     MR Abdomen 07/18/23 IMPRESSION: Multiple enhancing hepatic lesions, as above. Multiple benign hepatic adenomas are favored over benign FNH, although both remain possible. In the absence of known primary malignancy,  metastatic disease is unlikely. Follow-up Eovist  MRI abdomen with/without contrast is suggested in 3 months.   Severe hepatic steatosis with areas of focal fatty sparing.   Riedel's lobe configuration with enlarged inferior right hepatic lobe.  CTA Abdomen 07/05/23 IMPRESSION: VASCULAR   1. No evidence of renal artery stenosis or fibromuscular dysplasia. 2. No aneurysm, dissection or significant atherosclerotic plaque.   NON-VASCULAR   1. Advanced hepatic steatosis with several masslike areas of higher attenuation which are favored to represent masslike areas of fatty sparing. However, true underlying lesions including multiple hepatic adenomas are difficult to exclude entirely on the arterial phase imaging. Recommend further evaluation with contrast enhanced MRI of the abdomen for further evaluation and confirmation. 2. Mild scattered colonic diverticulosis.  No results found for: LIPOA  Risk Assessment/Calculations   HYPERTENSION CONTROL Vitals:   05/19/24 0902 05/19/24 1258  BP: (!) 158/88 (!) 140/93    The patient's blood pressure is elevated above target today.  In order to address the patient's elevated BP: A current anti-hypertensive medication was adjusted today.          Physical Exam VS:  BP (!) 140/93   Pulse 81   Ht 5' 5 (1.651 m)   Wt 220 lb (99.8 kg)   SpO2 97%   BMI 36.61 kg/m    Wt Readings from Last 3 Encounters:  05/19/24 220 lb (99.8 kg)  04/18/24  228 lb 8 oz (103.6 kg)  03/17/24 222 lb 10.6 oz (101 kg)    GEN: Well nourished, well developed in no acute distress NECK: No JVD; No carotid bruits CARDIAC: RRR, no murmurs, rubs, gallops RESPIRATORY:  Clear to auscultation without rales, wheezing or rhonchi  ABDOMEN: Soft, non-tender, non-distended EXTREMITIES:  No edema; No deformity    Assessment & Plan Hypertension   Morbid obesity Blood pressure is elevated in clinic and remains elevated on my recheck.  Initial BP 158/88.  BP right  arm after rest 144/88, left arm with home Omron cuff 140/93.  She reports elevated home readings consistently over the last week.  She is compliant with antihypertensive therapy.  Notes elevated BP is accompanied by dizziness, blurred vision, and numbness in her extremities. No adverse side effects from medications. Dietary sodium intake is low. She is achieving significant weight loss on GLP-1 agonist. Prior imaging without renal artery stenosis, fibromuscular dysplasia, or adrenal tumor. No evidence of hyperaldosteronism on labs completed 05/2023.  Kidney function/electrolytes stable on labs completed 03/16/2024. - Continue telmisartan  80 mg daily, amlodipine  10 mg daily, Aldactone  50 mg daily, chlorthalidone  25 mg daily, and hydralazine 100 mg twice daily - Increase carvedilol  to 25 mg twice daily  - Maintain a low sodium diet - Regular physical activity, including walking and weightlifting, is encouraged - Check TSH today to ensure stability of thyroid function - Continue GLP-1 agonist for weight loss  Chest pain Dyslipidemia Occasional nonexertional chest pains.  She continues to walk for exercise with no symptoms concerning for angina.  CCTA 12/24/2022 with CAC of 0, mild total plaque volume with no obvious stenosis. No indication for further ischemia evaluation at this time. LDL of 121 on 11/09/23, not specifically addressed today. Favor LDL 70 or lower given mild TPV.  - Continue amlodipine , carvedilol , simvastatin - Recheck lipid prior to next appointment  Poor sleep hygiene Admits to poor sleep, often sleeping only a few hours per night and frequently up and down.  No orthopnea, PND contributing.  This could be contributing to resistant hypertension. - Good sleep hygiene habits shared - 7-8 hours sleep recommended          Dispo: Two weeks with Dr. Raford  Signed, Rosaline Bane, NP-C "

## 2024-05-18 NOTE — Telephone Encounter (Signed)
 Spoke with patient and who stated that her blood pressure has been running high for the last couple months.  Last night about an hour after her medications her blood pressure was 167/105 PCP has been working with her and has changed some of her medications   Scheduled patient appointment with Rosaline RAMAN NP for tomorrow Asked her to bring readings and machine to visit tomorrow         Per [facility] pick up dates  Amlodipine  10 mg every am   12/16 #90 Chlorthalidone  50 mg every am  11/25 #90 Aldactone  50 mg every am   10/19 #90 (ready for pick up) Telmisartan  40 mg every am   6/12 #90  Carvedilol  12.5 mg twice a day  25 mg tablets 1/30 #60  Hydralazine 100 mg twice a day  25 mg 12/29 #180   Per [facility] Tamsulosin 0.4 mg picked up 1/14 for #30

## 2024-05-18 NOTE — Telephone Encounter (Signed)
" °  STAT if BP is GREATER than 180/120 TODAY.  STAT if BP is LESS than 90/60 and SYMPTOMATIC TODAY  1. What is your BP concern? HTN   2. Have you taken any BP medication today? Yes   3. What are your last 5 BP readings?167/105  4. Are you having any other symptoms (ex. Dizziness, headache, blurred vision, passed out)? Yes.   \  "

## 2024-05-19 ENCOUNTER — Encounter (HOSPITAL_BASED_OUTPATIENT_CLINIC_OR_DEPARTMENT_OTHER): Payer: Self-pay | Admitting: Nurse Practitioner

## 2024-05-19 ENCOUNTER — Ambulatory Visit (HOSPITAL_BASED_OUTPATIENT_CLINIC_OR_DEPARTMENT_OTHER): Admitting: Nurse Practitioner

## 2024-05-19 ENCOUNTER — Encounter (HOSPITAL_BASED_OUTPATIENT_CLINIC_OR_DEPARTMENT_OTHER): Payer: Self-pay

## 2024-05-19 VITALS — BP 140/93 | HR 81 | Ht 65.0 in | Wt 220.0 lb

## 2024-05-19 DIAGNOSIS — R079 Chest pain, unspecified: Secondary | ICD-10-CM | POA: Diagnosis not present

## 2024-05-19 DIAGNOSIS — I10 Essential (primary) hypertension: Secondary | ICD-10-CM | POA: Diagnosis not present

## 2024-05-19 DIAGNOSIS — E785 Hyperlipidemia, unspecified: Secondary | ICD-10-CM

## 2024-05-19 DIAGNOSIS — Z72821 Inadequate sleep hygiene: Secondary | ICD-10-CM

## 2024-05-19 DIAGNOSIS — I1A0 Resistant hypertension: Secondary | ICD-10-CM

## 2024-05-19 MED ORDER — CARVEDILOL 25 MG PO TABS: 25.0000 mg | ORAL_TABLET | Freq: Two times a day (BID) | ORAL | Status: AC

## 2024-05-19 NOTE — Patient Instructions (Signed)
 Medication Instructions:   INCREASE Coreg  one (1) tablet by mouth ( 25 mg) twice daily.   *If you need a refill on your cardiac medications before your next appointment, please call your pharmacy*  Lab Work:  TODAY!!!! TSH  If you have labs (blood work) drawn today and your tests are completely normal, you will receive your results only by: MyChart Message (if you have MyChart) OR A paper copy in the mail If you have any lab test that is abnormal or we need to change your treatment, we will call you to review the results.  Testing/Procedures:  None ordered.   Follow-Up: At [facility], you and your health needs are our priority.  As part of our continuing mission to provide you with exceptional heart care, our providers are all part of one team.  This team includes your primary Cardiologist (physician) and Advanced Practice Providers or APPs (Physician Assistants and Nurse Practitioners) who all work together to provide you with the care you need, when you need it.  Your next appointment:   2 week(s)  Provider:   Annabella Scarce, MD    We recommend signing up for the patient portal called MyChart.  Sign up information is provided on this After Visit Summary.  MyChart is used to connect with patients for Virtual Visits (Telemedicine).  Patients are able to view lab/test results, encounter notes, upcoming appointments, etc.  Non-urgent messages can be sent to your provider as well.   To learn more about what you can do with MyChart, go to [email or web address].   Other Instructions

## 2024-05-20 ENCOUNTER — Other Ambulatory Visit (HOSPITAL_BASED_OUTPATIENT_CLINIC_OR_DEPARTMENT_OTHER): Payer: Self-pay | Admitting: *Deleted

## 2024-05-20 ENCOUNTER — Ambulatory Visit (HOSPITAL_BASED_OUTPATIENT_CLINIC_OR_DEPARTMENT_OTHER): Payer: Self-pay | Admitting: Nurse Practitioner

## 2024-05-20 ENCOUNTER — Encounter (HOSPITAL_BASED_OUTPATIENT_CLINIC_OR_DEPARTMENT_OTHER): Payer: Self-pay | Admitting: *Deleted

## 2024-05-20 DIAGNOSIS — E785 Hyperlipidemia, unspecified: Secondary | ICD-10-CM

## 2024-05-20 DIAGNOSIS — Z5181 Encounter for therapeutic drug level monitoring: Secondary | ICD-10-CM

## 2024-05-20 DIAGNOSIS — I10 Essential (primary) hypertension: Secondary | ICD-10-CM

## 2024-05-20 LAB — TSH: TSH: 1.32 u[IU]/mL (ref 0.450–4.500)

## 2024-06-03 ENCOUNTER — Encounter (HOSPITAL_BASED_OUTPATIENT_CLINIC_OR_DEPARTMENT_OTHER): Admitting: Cardiovascular Disease
